# Patient Record
Sex: Female | Born: 1985 | Race: Black or African American | Hispanic: No | Smoking: Never smoker
Health system: Southern US, Community
[De-identification: ages and names within clinical notes are randomized; demographics above are authoritative.]

## PROBLEM LIST (undated history)

## (undated) DIAGNOSIS — F329 Major depressive disorder, single episode, unspecified: Secondary | ICD-10-CM

## (undated) DIAGNOSIS — F419 Anxiety disorder, unspecified: Secondary | ICD-10-CM

## (undated) DIAGNOSIS — R7303 Prediabetes: Secondary | ICD-10-CM

## (undated) DIAGNOSIS — R002 Palpitations: Secondary | ICD-10-CM

## (undated) DIAGNOSIS — D573 Sickle-cell trait: Secondary | ICD-10-CM

## (undated) DIAGNOSIS — D649 Anemia, unspecified: Secondary | ICD-10-CM

## (undated) DIAGNOSIS — L409 Psoriasis, unspecified: Secondary | ICD-10-CM

## (undated) DIAGNOSIS — R519 Headache, unspecified: Secondary | ICD-10-CM

## (undated) DIAGNOSIS — Z9889 Other specified postprocedural states: Secondary | ICD-10-CM

## (undated) DIAGNOSIS — F32A Depression, unspecified: Secondary | ICD-10-CM

## (undated) DIAGNOSIS — K219 Gastro-esophageal reflux disease without esophagitis: Secondary | ICD-10-CM

## (undated) DIAGNOSIS — D219 Benign neoplasm of connective and other soft tissue, unspecified: Secondary | ICD-10-CM

## (undated) DIAGNOSIS — R0600 Dyspnea, unspecified: Secondary | ICD-10-CM

## (undated) DIAGNOSIS — I10 Essential (primary) hypertension: Secondary | ICD-10-CM

## (undated) HISTORY — PX: TONSILLECTOMY: SUR1361

## (undated) HISTORY — DX: Benign neoplasm of connective and other soft tissue, unspecified: D21.9

## (undated) HISTORY — DX: Psoriasis, unspecified: L40.9

## (undated) HISTORY — DX: Sickle-cell trait: D57.3

---
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
## 2005-01-12 ENCOUNTER — Emergency Department: Payer: Self-pay | Admitting: Unknown Physician Specialty

## 2005-02-04 ENCOUNTER — Emergency Department: Payer: Self-pay | Admitting: Emergency Medicine

## 2005-05-30 ENCOUNTER — Emergency Department: Payer: Self-pay | Admitting: Emergency Medicine

## 2005-05-31 ENCOUNTER — Ambulatory Visit: Payer: Self-pay | Admitting: Emergency Medicine

## 2005-07-28 ENCOUNTER — Emergency Department: Payer: Self-pay | Admitting: Internal Medicine

## 2005-07-30 ENCOUNTER — Ambulatory Visit: Payer: Self-pay | Admitting: Obstetrics and Gynecology

## 2006-01-18 ENCOUNTER — Emergency Department: Payer: Self-pay | Admitting: Emergency Medicine

## 2006-01-20 ENCOUNTER — Emergency Department: Payer: Self-pay | Admitting: Emergency Medicine

## 2006-01-26 ENCOUNTER — Emergency Department: Payer: Self-pay | Admitting: Emergency Medicine

## 2010-09-15 ENCOUNTER — Emergency Department: Payer: Self-pay | Admitting: Emergency Medicine

## 2011-03-02 ENCOUNTER — Emergency Department: Payer: Self-pay | Admitting: Emergency Medicine

## 2011-03-09 ENCOUNTER — Emergency Department: Payer: Self-pay | Admitting: Emergency Medicine

## 2011-07-12 ENCOUNTER — Ambulatory Visit: Payer: Self-pay | Admitting: Internal Medicine

## 2011-07-31 ENCOUNTER — Ambulatory Visit: Payer: Self-pay | Admitting: Internal Medicine

## 2012-07-15 ENCOUNTER — Ambulatory Visit: Payer: Self-pay | Admitting: Family Medicine

## 2012-09-28 ENCOUNTER — Ambulatory Visit: Payer: Self-pay

## 2012-09-28 LAB — RAPID STREP-A WITH REFLX: Micro Text Report: NEGATIVE

## 2014-12-15 ENCOUNTER — Emergency Department: Payer: Self-pay | Admitting: Emergency Medicine

## 2015-12-19 ENCOUNTER — Ambulatory Visit
Admission: EM | Admit: 2015-12-19 | Discharge: 2015-12-19 | Disposition: A | Discharge status: Home / Self Care | Payer: Managed Care, Other (non HMO) | Attending: Family Medicine | Admitting: Family Medicine

## 2015-12-19 DIAGNOSIS — B349 Viral infection, unspecified: Secondary | ICD-10-CM

## 2015-12-19 LAB — RAPID STREP SCREEN (MED CTR MEBANE ONLY): Streptococcus, Group A Screen (Direct): NEGATIVE

## 2015-12-19 LAB — RAPID INFLUENZA A&B ANTIGENS: Influenza B (ARMC): NEGATIVE

## 2015-12-19 LAB — RAPID INFLUENZA A&B ANTIGENS (ARMC ONLY): INFLUENZA A (ARMC): NEGATIVE

## 2015-12-19 MED ORDER — HYDROCOD POLST-CPM POLST ER 10-8 MG/5ML PO SUER: 5.0000 mL | Freq: Every evening | ORAL | Status: DC | PRN

## 2015-12-19 MED ORDER — LIDOCAINE VISCOUS 2 % MT SOLN: 15.0000 mL | Freq: Three times a day (TID) | OROMUCOSAL | Status: DC | PRN

## 2015-12-19 MED ORDER — BENZONATATE 100 MG PO CAPS: 100.0000 mg | ORAL_CAPSULE | Freq: Three times a day (TID) | ORAL | Status: DC | PRN

## 2015-12-19 NOTE — ED Provider Notes (Signed)
[facility]  ____________________________________________  Time seen: Approximately 7:15 PM  I have reviewed the triage vital signs and the nursing notes.   HISTORY  Chief Complaint URI   HPI Cristina Allen is a 30 y.o. female presents for complaints of 3 days of runny nose, sore throat, nasal congestion and sinus drainage. States cough and chest congestion started yesterday. States "everyone" at work has been sick recently.  Denies fevers. Reports continues to eat and drink well. States main complaint at this time is sore throat and cough. States sore throat is a scratchy irritated feeling. Denies difficulty swallowing. Denies pain with eating. Reports initially thought that she was just having seasonal allergies. States symptoms have been unresolved over-the-counter Claritin or Allegra.  Denies chest pain or shortness of breath, chest pain with deep breath, palpitations, syncope, near syncope, dizziness, weakness, fevers, abdominal pain, weakness, extremity pain, extremity swelling.  Patient's last menstrual period was 11/29/2015. Denies chance of pregnancy.    History reviewed. No pertinent past medical history.  There are no active problems to display for this patient.   Past Surgical History  Procedure Laterality Date  . Tonsillectomy      Current Outpatient Rx  Name  Route  Sig  Dispense  Refill  .           .           .             Allergies Review of patient's allergies indicates no known allergies.  History reviewed. No pertinent family history.  Social History Social History  Substance Use Topics  . Smoking status: Former Research scientist (life sciences)  . Smokeless tobacco: Never Used  . Alcohol Use: Yes    Review of Systems Constitutional: No fever/chills Eyes: No visual changes. ENT: Positive runny nose, congestion, sore throat and intermittent cough. Cardiovascular: Denies chest pain. Respiratory: Denies shortness of breath. Gastrointestinal: No abdominal  pain.  No nausea, no vomiting.  No diarrhea.  No constipation. Genitourinary: Negative for dysuria. Musculoskeletal: Negative for back pain. Skin: Negative for rash. Neurological: Negative for headaches, focal weakness or numbness.  10-point ROS otherwise negative.  ____________________________________________   PHYSICAL EXAM:  VITAL SIGNS: ED Triage Vitals  Enc Vitals Group     BP 12/19/15 1810 130/86 mmHg     Pulse Rate 12/19/15 1810 86     Resp 12/19/15 1810 18     Temp 12/19/15 1810 97.9 F (36.6 C)     Temp Source 12/19/15 1810 Oral     SpO2 12/19/15 1810 97 %     Weight 12/19/15 1810 136 lb (61.689 kg)     Height 12/19/15 1810 5' (1.524 m)     Head Cir --      Peak Flow --      Pain Score 12/19/15 1813 6     Pain Loc --      Pain Edu? --      Excl. in [place]? --   Constitutional: Alert and oriented. Well appearing and in no acute distress. Eyes: Conjunctivae are normal. PERRL. EOMI. Head: Atraumatic. No sinus tenderness to palpation. No swelling. No erythema.  Ears: no erythema, normal TMs bilaterally.   Nose:Nasal congestion with clear rhinorrhea  Mouth/Throat: Mucous membranes are moist. Mild pharyngeal erythema. No tonsillar swelling or exudate.  Neck: No stridor.  No cervical spine tenderness to palpation. Hematological/Lymphatic/Immunilogical: No cervical lymphadenopathy. Cardiovascular: Normal rate, regular rhythm. Grossly normal heart sounds.  Good peripheral circulation.Chest nontender.  Respiratory: Normal respiratory effort.  No retractions. Lungs CTAB.No wheezes, rales or rhonchi. Good air movement.  Gastrointestinal: Soft and nontender. Normal Bowel sounds. No CVA tenderness. Musculoskeletal: No lower or upper extremity tenderness nor edema. No cervical, thoracic or lumbar tenderness to palpation. Neurologic:  Normal speech and language. No gross focal neurologic deficits are appreciated. No gait instability. Skin:  Skin is warm, dry and intact. No rash  noted. Psychiatric: Mood and affect are normal. Speech and behavior are normal.  ____________________________________________   LABS (all labs ordered are listed, but only abnormal results are displayed)  Labs Reviewed  RAPID INFLUENZA A&B ANTIGENS ([facility] ONLY)  RAPID STREP SCREEN (NOT AT [facility])  CULTURE, GROUP A STREP [facility])    INITIAL IMPRESSION / ASSESSMENT AND PLAN / ED COURSE  Pertinent labs & imaging results that were available during my care of the patient were reviewed by me and considered in my medical decision making (see chart for details).  Very well-appearing patient. Smiling in room. No acute chest. Presented with complaints of 3 days of runny nose, nasal congestion, sore throat and cough. States mild sore throat at this time. Denies other pain. Lungs clear throughout. Abdomen soft and nontender. Suspect viral illness. Will evaluate for influenza as well as strep.  Quick strep negative, will culture. Influenza negative. Discussed supportive and symptomatic treatments with patient. Will treat patient with when necessary viscous lidocaine gargles, when necessary Tessalon Perles during the day as well as Tussionex as needed at night. Encouraged patient to continue taking home Claritin. Encouraged rest, fluids, over-the-counter Tylenol or ibuprofen as needed. Work note for today and tomorrow given.  Discussed follow up with Primary care physician this week. Discussed follow up and return parameters including no resolution or any worsening concerns. Patient verbalized understanding and agreed to plan.   ____________________________________________   FINAL CLINICAL IMPRESSION(S) / ED DIAGNOSES  Final diagnoses:  Viral illness      Note: This dictation was prepared with Dragon dictation along with smaller phrase technology. Any transcriptional errors that result from this process are unintentional.    Marylene Land, NP 12/20/15 0008

## 2015-12-19 NOTE — Discharge Instructions (Signed)
Take medication as prescribed. Rest. Drink plenty of fluids. Take over the counter tylenol or ibuprofen as needed.   Follow up with your primary care physician this week as needed. Return to Urgent care for new or worsening concerns.  Viral Infections A viral infection can be caused by different types of viruses.Most viral infections are not serious and resolve on their own. However, some infections may cause severe symptoms and may lead to further complications. SYMPTOMS Viruses can frequently cause:  Minor sore throat.  Aches and pains.  Headaches.  Runny nose.  Different types of rashes.  Watery eyes.  Tiredness.  Cough.  Loss of appetite.  Gastrointestinal infections, resulting in nausea, vomiting, and diarrhea. These symptoms do not respond to antibiotics because the infection is not caused by bacteria. However, you might catch a bacterial infection following the viral infection. This is sometimes called a "superinfection." Symptoms of such a bacterial infection may include:  Worsening sore throat with pus and difficulty swallowing.  Swollen neck glands.  Chills and a high or persistent fever.  Severe headache.  Tenderness over the sinuses.  Persistent overall ill feeling (malaise), muscle aches, and tiredness (fatigue).  Persistent cough.  Yellow, green, or brown mucus production with coughing. HOME CARE INSTRUCTIONS   Only take over-the-counter or prescription medicines for pain, discomfort, diarrhea, or fever as directed by your caregiver.  Drink enough water and fluids to keep your urine clear or pale yellow. Sports drinks can provide valuable electrolytes, sugars, and hydration.  Get plenty of rest and maintain proper nutrition. Soups and broths with crackers or rice are fine. SEEK IMMEDIATE MEDICAL CARE IF:   You have severe headaches, shortness of breath, chest pain, neck pain, or an unusual rash.  You have uncontrolled vomiting, diarrhea, or you  are unable to keep down fluids.  You or your child has an oral temperature above 102 F (38.9 C), not controlled by medicine.  Your baby is older than 3 months with a rectal temperature of 102 F (38.9 C) or higher.  Your baby is 49 months old or younger with a rectal temperature of 100.4 F (38 C) or higher. MAKE SURE YOU:   Understand these instructions.  Will watch your condition.  Will get help right away if you are not doing well or get worse.   This information is not intended to replace advice given to you by your health care provider. Make sure you discuss any questions you have with your health care provider.   Document Released: 06/26/2005 Document Revised: 12/09/2011 Document Reviewed: 02/22/2015 Elsevier Interactive Patient Education [employer].

## 2015-12-19 NOTE — ED Notes (Signed)
Patient c/o sore throat, chest pain while coughing, nasal congestion, and headaches which all started this past Sunday.  Denies fever/c/n/v.

## 2015-12-21 LAB — CULTURE, GROUP A STREP (THRC)

## 2016-06-24 ENCOUNTER — Encounter: Payer: Self-pay | Admitting: Family Medicine

## 2016-06-24 ENCOUNTER — Ambulatory Visit (INDEPENDENT_AMBULATORY_CARE_PROVIDER_SITE_OTHER): Payer: Managed Care, Other (non HMO) | Admitting: Family Medicine

## 2016-06-24 VITALS — BP 130/80 | HR 68 | Ht 61.0 in | Wt 150.0 lb

## 2016-06-24 DIAGNOSIS — Z7189 Other specified counseling: Secondary | ICD-10-CM

## 2016-06-24 DIAGNOSIS — G43809 Other migraine, not intractable, without status migrainosus: Secondary | ICD-10-CM

## 2016-06-24 DIAGNOSIS — Z7689 Persons encountering health services in other specified circumstances: Secondary | ICD-10-CM

## 2016-06-24 NOTE — Patient Instructions (Signed)

## 2016-06-24 NOTE — Progress Notes (Signed)
Name: Cristina Allen   MRN: [id number]    DOB: 12-22-1985   Date:06/24/2016       Progress Note  Subjective  Chief Complaint  Chief Complaint  Patient presents with  . Establish Care    hasn't had a primary care physician  . Headache    longer than a year- has approx 1 per month. Needs referral to neurology    Headache   This is a recurrent problem. The current episode started 1 to 4 weeks ago. The problem occurs intermittently (come and go). Progression since onset: comes and goes. The pain is located in the bilateral (alternates sides) region. The pain quality is similar to prior headaches. The quality of the pain is described as throbbing. The pain is at a severity of 10/10. The pain is severe. Associated symptoms include nausea, phonophobia and photophobia. Pertinent negatives include no abdominal pain, back pain, blurred vision, coughing, dizziness, ear pain, eye redness, eye watering, facial sweating, fever, insomnia, loss of balance, neck pain, numbness, rhinorrhea, sinus pressure, sore throat, tingling, tinnitus or weight loss. The symptoms are aggravated by bright light and noise. She has tried Excedrin and acetaminophen for the symptoms. The treatment provided mild relief. Her past medical history is significant for migraine headaches. There is no history of cancer, cluster headaches, hypertension or TMJ.    No problem-specific Assessment & Plan notes found for this encounter.   Past Medical History:  Diagnosis Date  . Psoriasis of scalp     Past Surgical History:  Procedure Laterality Date  . DILATION AND CURETTAGE OF UTERUS  2006  . TONSILLECTOMY      Family History  Problem Relation Age of Onset  . Diabetes Maternal Grandfather     Social History   Social History  . Marital status: Single    Spouse name: N/A  . Number of children: N/A  . Years of education: N/A   Occupational History  . Not on file.   Social History Main Topics  . Smoking status: Former  Research scientist (life sciences)  . Smokeless tobacco: Never Used  . Alcohol use Yes  . Drug use: No  . Sexual activity: Yes   Other Topics Concern  . Not on file   Social History Narrative  . No narrative on file    No Known Allergies   Review of Systems  Constitutional: Negative for chills, fever, malaise/fatigue and weight loss.  HENT: Negative for ear discharge, ear pain, rhinorrhea, sinus pressure, sore throat and tinnitus.   Eyes: Positive for photophobia. Negative for blurred vision and redness.  Respiratory: Negative for cough, sputum production, shortness of breath and wheezing.   Cardiovascular: Negative for chest pain, palpitations and leg swelling.  Gastrointestinal: Positive for nausea. Negative for abdominal pain, blood in stool, constipation, diarrhea, heartburn and melena.  Genitourinary: Negative for dysuria, frequency, hematuria and urgency.  Musculoskeletal: Negative for back pain, joint pain, myalgias and neck pain.  Skin: Negative for rash.  Neurological: Positive for headaches. Negative for dizziness, tingling, sensory change, focal weakness, numbness and loss of balance.  Endo/Heme/Allergies: Negative for environmental allergies and polydipsia. Does not bruise/bleed easily.  Psychiatric/Behavioral: Negative for depression and suicidal ideas. The patient is not nervous/anxious and does not have insomnia.      Objective  Vitals:   06/24/16 1508  BP: 130/80  Pulse: 68  Weight: 150 lb (68 kg)  Height: 5\' 1"  (1.549 m)    Physical Exam  Constitutional: She is well-developed, well-nourished, and in no distress. No distress.  HENT:  Head: Normocephalic and atraumatic.  Right Ear: Tympanic membrane, external ear and ear canal normal.  Left Ear: Tympanic membrane, external ear and ear canal normal.  Nose: Nose normal. Right sinus exhibits no maxillary sinus tenderness and no frontal sinus tenderness. Left sinus exhibits no maxillary sinus tenderness and no frontal sinus tenderness.   Mouth/Throat: Uvula is midline and oropharynx is clear and moist.  Eyes: Conjunctivae and EOM are normal. Pupils are equal, round, and reactive to light. Right eye exhibits no discharge. Left eye exhibits no discharge.  Fundoscopic exam:      The right eye shows no arteriolar narrowing, no AV nicking and no papilledema.       The left eye shows no arteriolar narrowing, no AV nicking and no papilledema.  Neck: Normal range of motion. Neck supple. No JVD present. No thyromegaly present.  Cardiovascular: Normal rate, regular rhythm, normal heart sounds and intact distal pulses.  Exam reveals no gallop and no friction rub.   No murmur heard. Pulmonary/Chest: Effort normal and breath sounds normal.  Abdominal: Soft. Bowel sounds are normal. She exhibits no mass. There is no tenderness. There is no guarding.  Musculoskeletal: Normal range of motion. She exhibits no edema.  Lymphadenopathy:       Head (right side): No submental and no submandibular adenopathy present.       Head (left side): No submental and no submandibular adenopathy present.    She has no cervical adenopathy.    She has no axillary adenopathy.  Neurological: She is alert. She has normal motor skills, normal sensation, normal strength, normal reflexes and intact cranial nerves.  Skin: Skin is warm, dry and intact. She is not diaphoretic.  Psychiatric: Mood and affect normal.      Assessment & Plan  Problem List Items Addressed This Visit    None    Visit Diagnoses    Encounter to establish care with new doctor    -  Primary   Migraine variant       Relevant Orders   Ambulatory referral to Neurology     I spent 30 minutes with this patient, More than 50% of that time was spent in face to face education, counseling and care coordination.   Dr. Macon [facility]  06/24/16

## 2016-09-17 ENCOUNTER — Ambulatory Visit
Admission: EM | Admit: 2016-09-17 | Discharge: 2016-09-17 | Disposition: A | Discharge status: Home / Self Care | Payer: Managed Care, Other (non HMO) | Attending: Family Medicine | Admitting: Family Medicine

## 2016-09-17 ENCOUNTER — Encounter: Payer: Self-pay | Admitting: *Deleted

## 2016-09-17 DIAGNOSIS — A084 Viral intestinal infection, unspecified: Secondary | ICD-10-CM

## 2016-09-17 DIAGNOSIS — E876 Hypokalemia: Secondary | ICD-10-CM

## 2016-09-17 LAB — URINALYSIS, COMPLETE (UACMP) WITH MICROSCOPIC
Bilirubin Urine: NEGATIVE
GLUCOSE, UA: NEGATIVE mg/dL
KETONES UR: NEGATIVE mg/dL
Leukocytes, UA: NEGATIVE
Nitrite: NEGATIVE
Specific Gravity, Urine: 1.02 (ref 1.005–1.030)
pH: 5.5 (ref 5.0–8.0)

## 2016-09-17 LAB — CBC WITH DIFFERENTIAL/PLATELET
BASOS ABS: 0 10*3/uL (ref 0–0.1)
BASOS PCT: 0 %
EOS PCT: 1 %
Eosinophils Absolute: 0.1 10*3/uL (ref 0–0.7)
HCT: 40.3 % (ref 35.0–47.0)
Hemoglobin: 13.4 g/dL (ref 12.0–16.0)
LYMPHS PCT: 13 %
Lymphs Abs: 0.8 10*3/uL — ABNORMAL LOW (ref 1.0–3.6)
MCH: 26.7 pg (ref 26.0–34.0)
MCHC: 33.2 g/dL (ref 32.0–36.0)
MCV: 80.3 fL (ref 80.0–100.0)
MONO ABS: 0.4 10*3/uL (ref 0.2–0.9)
Monocytes Relative: 7 %
NEUTROS ABS: 5 10*3/uL (ref 1.4–6.5)
NEUTROS PCT: 79 %
PLATELETS: 199 10*3/uL (ref 150–440)
RBC: 5.02 MIL/uL (ref 3.80–5.20)
RDW: 13.6 % (ref 11.5–14.5)
WBC: 6.3 10*3/uL (ref 3.6–11.0)

## 2016-09-17 LAB — COMPREHENSIVE METABOLIC PANEL
ALBUMIN: 3.7 g/dL (ref 3.5–5.0)
ALT: 13 U/L — AB (ref 14–54)
AST: 23 U/L (ref 15–41)
Alkaline Phosphatase: 34 U/L — ABNORMAL LOW (ref 38–126)
Anion gap: 8 (ref 5–15)
BUN: 8 mg/dL (ref 6–20)
CHLORIDE: 103 mmol/L (ref 101–111)
CO2: 24 mmol/L (ref 22–32)
CREATININE: 0.76 mg/dL (ref 0.44–1.00)
Calcium: 8 mg/dL — ABNORMAL LOW (ref 8.9–10.3)
GFR calc Af Amer: >60 mL/min (ref >60)
GLUCOSE: 110 mg/dL — AB (ref 65–99)
POTASSIUM: 3.1 mmol/L — AB (ref 3.5–5.1)
SODIUM: 135 mmol/L (ref 135–145)
Total Bilirubin: 0.7 mg/dL (ref 0.3–1.2)
Total Protein: 6.9 g/dL (ref 6.5–8.1)

## 2016-09-17 MED ORDER — POTASSIUM CHLORIDE CRYS ER 20 MEQ PO TBCR
20.0000 meq | EXTENDED_RELEASE_TABLET | Freq: Once | ORAL | Status: AC
  Administered 2016-09-17: 20 meq via ORAL

## 2016-09-17 MED ORDER — ONDANSETRON 8 MG PO TBDP
8.0000 mg | ORAL_TABLET | Freq: Once | ORAL | Status: AC
  Administered 2016-09-17: 8 mg via ORAL

## 2016-09-17 MED ORDER — ONDANSETRON 8 MG PO TBDP: 8.0000 mg | ORAL_TABLET | Freq: Three times a day (TID) | ORAL | 0 refills | Status: DC | PRN

## 2016-09-17 NOTE — ED Provider Notes (Signed)
[facility]    CSN: [id number] Arrival date & time: 09/17/16  1535     History   Chief Complaint Chief Complaint  Patient presents with  . Back Pain  . Abdominal Pain  . Emesis  . Nausea    HPI Cristina Allen is a 30 y.o. female.   30 yo female with a c/o generalized abdominal pain, nausea, vomiting and diarrhea since yesterday. Denies any fevers, chills, melena, hematochezia, dysuria, vaginal discharge, known sick contacts.    The history is provided by the patient.  Back Pain  Associated symptoms: abdominal pain   Abdominal Pain  Associated symptoms: vomiting   Emesis  Associated symptoms: abdominal pain     Past Medical History:  Diagnosis Date  . Psoriasis of scalp     There are no active problems to display for this patient.   Past Surgical History:  Procedure Laterality Date  . DILATION AND CURETTAGE OF UTERUS  2006  . TONSILLECTOMY      OB History    No data available       Home Medications    Prior to Admission medications   Medication Sig Start Date End Date Taking? Authorizing Provider  clobetasol (OLUX) 0.05 % topical foam derm 05/28/16   Historical Provider, MD  ondansetron (ZOFRAN ODT) 8 MG disintegrating tablet Take 1 tablet (8 mg total) by mouth every 8 (eight) hours as needed for nausea or vomiting. 09/17/16   Norval Gable, MD  XOLEGEL 2 % GEL derm 05/28/16   Historical Provider, MD    Family History Family History  Problem Relation Age of Onset  . Diabetes Maternal Grandfather     Social History Social History  Substance Use Topics  . Smoking status: Former Research scientist (life sciences)  . Smokeless tobacco: Never Used  . Alcohol use Yes     Allergies   Patient has no known allergies.   Review of Systems Review of Systems  Gastrointestinal: Positive for abdominal pain and vomiting.  Musculoskeletal: Positive for back pain.     Physical Exam Triage Vital Signs ED Triage Vitals  Enc Vitals Group     BP 09/17/16 1653  121/90     Pulse Rate 09/17/16 1653 98     Resp 09/17/16 1653 16     Temp 09/17/16 1653 98.8 F (37.1 C)     Temp Source 09/17/16 1653 Oral     SpO2 09/17/16 1653 97 %     Weight 09/17/16 1655 136 lb (61.7 kg)     Height 09/17/16 1655 5' (1.524 m)     Head Circumference --      Peak Flow --      Pain Score --      Pain Loc --      Pain Edu? --      Excl. in [place]? --    No data found.   Updated Vital Signs BP 121/90 (BP Location: Left Arm)   Pulse 98   Temp 98.8 F (37.1 C) (Oral)   Resp 16   Ht 5' (1.524 m)   Wt 136 lb (61.7 kg)   LMP 09/13/2016 (Exact Date)   SpO2 97%   BMI 26.56 kg/m   Visual Acuity Right Eye Distance:   Left Eye Distance:   Bilateral Distance:    Right Eye Near:   Left Eye Near:    Bilateral Near:     Physical Exam  Constitutional: She appears well-developed and well-nourished. No distress.  Abdominal: Soft. Bowel sounds are  normal. She exhibits no distension and no mass. There is tenderness (mild, diffuse; no rebound or guarding). There is no rebound and no guarding.  Skin: She is not diaphoretic.  Nursing note and vitals reviewed.    UC Treatments / Results  Labs (all labs ordered are listed, but only abnormal results are displayed) Labs Reviewed  CBC WITH DIFFERENTIAL/PLATELET - Abnormal; Notable for the following:       Result Value   Lymphs Abs 0.8 (*)    All other components within normal limits  COMPREHENSIVE METABOLIC PANEL - Abnormal; Notable for the following:    Potassium 3.1 (*)    Glucose, Bld 110 (*)    Calcium 8.0 (*)    ALT 13 (*)    Alkaline Phosphatase 34 (*)    All other components within normal limits  URINALYSIS, COMPLETE (UACMP) WITH MICROSCOPIC - Abnormal; Notable for the following:    Hgb urine dipstick LARGE (*)    Protein, ur TRACE (*)    Squamous Epithelial / LPF 6-30 (*)    Bacteria, UA RARE (*)    All other components within normal limits    EKG  EKG Interpretation None       Radiology No  results found.  Procedures Procedures (including critical care time)  Medications Ordered in UC Medications  ondansetron (ZOFRAN-ODT) disintegrating tablet 8 mg (8 mg Oral Given 09/17/16 1710)  potassium chloride SA (K-DUR,KLOR-CON) CR tablet 20 mEq (20 mEq Oral Given 09/17/16 1807)     Initial Impression / Assessment and Plan / UC Course  I have reviewed the triage vital signs and the nursing notes.  Pertinent labs & imaging results that were available during my care of the patient were reviewed by me and considered in my medical decision making (see chart for details).  Clinical Course       Final Clinical Impressions(s) / UC Diagnoses   Final diagnoses:  Viral gastroenteritis  Hypokalemia    New Prescriptions Discharge Medication List as of 09/17/2016  6:02 PM    START taking these medications   Details  ondansetron (ZOFRAN ODT) 8 MG disintegrating tablet Take 1 tablet (8 mg total) by mouth every 8 (eight) hours as needed for nausea or vomiting., Starting Tue 09/17/2016, Normal       1. Lab results and diagnosis reviewed with patient 2. rx as per orders above; reviewed possible side effects, interactions, risks and benefits  3. Recommend supportive treatment with clear liquids/increased fluids then advance slowly as tolerated 4. Follow-up prn if symptoms worsen or don't improve   Norval Gable, MD 09/17/16 2019

## 2016-09-17 NOTE — ED Triage Notes (Signed)
Low back pain, diffuse abd pain, body aches, N/V/D since yesterday.

## 2017-02-25 ENCOUNTER — Encounter: Payer: Self-pay | Admitting: Family Medicine

## 2017-02-25 ENCOUNTER — Ambulatory Visit (INDEPENDENT_AMBULATORY_CARE_PROVIDER_SITE_OTHER): Payer: Managed Care, Other (non HMO) | Admitting: Family Medicine

## 2017-02-25 VITALS — BP 110/70 | HR 72 | Ht 60.0 in | Wt 154.0 lb

## 2017-02-25 DIAGNOSIS — M545 Low back pain, unspecified: Secondary | ICD-10-CM

## 2017-02-25 DIAGNOSIS — M542 Cervicalgia: Secondary | ICD-10-CM | POA: Diagnosis not present

## 2017-02-25 DIAGNOSIS — M546 Pain in thoracic spine: Secondary | ICD-10-CM

## 2017-02-25 MED ORDER — MELOXICAM 7.5 MG PO TABS: 7.5000 mg | ORAL_TABLET | Freq: Two times a day (BID) | ORAL | 1 refills | Status: DC

## 2017-02-25 MED ORDER — PREDNISONE 10 MG PO TABS: ORAL_TABLET | ORAL | 1 refills | Status: DC

## 2017-02-25 MED ORDER — CYCLOBENZAPRINE HCL 10 MG PO TABS: 10.0000 mg | ORAL_TABLET | Freq: Three times a day (TID) | ORAL | 0 refills | Status: DC | PRN
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
## 2017-03-05 ENCOUNTER — Ambulatory Visit: Payer: Managed Care, Other (non HMO) | Admitting: Family Medicine

## 2017-03-11 ENCOUNTER — Ambulatory Visit: Payer: Managed Care, Other (non HMO) | Admitting: Family Medicine

## 2017-03-24 ENCOUNTER — Ambulatory Visit: Payer: Managed Care, Other (non HMO) | Admitting: Family Medicine

## 2017-03-27 ENCOUNTER — Ambulatory Visit: Payer: Managed Care, Other (non HMO) | Admitting: Family Medicine

## 2017-06-26 ENCOUNTER — Ambulatory Visit (INDEPENDENT_AMBULATORY_CARE_PROVIDER_SITE_OTHER): Payer: Managed Care, Other (non HMO) | Admitting: Family Medicine

## 2017-06-26 ENCOUNTER — Encounter: Payer: Self-pay | Admitting: Family Medicine

## 2017-06-26 VITALS — BP 110/80 | HR 84 | Ht 60.0 in | Wt 159.0 lb

## 2017-06-26 DIAGNOSIS — G8929 Other chronic pain: Secondary | ICD-10-CM | POA: Diagnosis not present

## 2017-06-26 DIAGNOSIS — M545 Low back pain, unspecified: Secondary | ICD-10-CM

## 2017-06-26 DIAGNOSIS — M25512 Pain in left shoulder: Secondary | ICD-10-CM

## 2017-06-26 DIAGNOSIS — E66811 Obesity, class 1: Secondary | ICD-10-CM

## 2017-06-26 DIAGNOSIS — N949 Unspecified condition associated with female genital organs and menstrual cycle: Secondary | ICD-10-CM | POA: Diagnosis not present

## 2017-06-26 DIAGNOSIS — E669 Obesity, unspecified: Secondary | ICD-10-CM | POA: Diagnosis not present

## 2017-06-26 NOTE — Progress Notes (Signed)
Name: Cristina Allen   MRN: [id number]    DOB: 1986-06-15   Date:06/26/2017       Progress Note  Subjective  Chief Complaint  Chief Complaint  Patient presents with  . Back Pain    lower back pain  . Shoulder Pain    L) shoulder pain radiating down to wrist    Back Pain  This is a chronic problem. The current episode started more than 1 year ago. The problem occurs daily. The problem has been waxing and waning since onset. The pain is present in the lumbar spine. The quality of the pain is described as aching. The pain does not radiate. The pain is at a severity of 6/10. The pain is moderate. The pain is worse during the day. The symptoms are aggravated by sitting (cycle). Pertinent negatives include no abdominal pain, bladder incontinence, bowel incontinence, chest pain, dysuria, fever, headaches, leg pain, numbness, paresis, paresthesias, pelvic pain, perianal numbness, tingling, weakness or weight loss. She has tried NSAIDs and muscle relaxant (given order for physical therapy by ortho) for the symptoms. The treatment provided moderate relief.  Shoulder Pain   The pain is present in the left shoulder. This is a chronic problem. The current episode started more than 1 year ago. The problem occurs intermittently. The problem has been unchanged. The quality of the pain is described as aching. The pain is at a severity of 0/10. The pain is mild. Pertinent negatives include no fever, joint swelling, limited range of motion, numbness, stiffness or tingling. The symptoms are aggravated by activity (at work). She has tried NSAIDS for the symptoms. Family history does not include rheumatoid arthritis.    No problem-specific Assessment & Plan notes found for this encounter.   Past Medical History:  Diagnosis Date  . Psoriasis of scalp     Past Surgical History:  Procedure Laterality Date  . DILATION AND CURETTAGE OF UTERUS  2006  . TONSILLECTOMY      Family History  Problem Relation Age  of Onset  . Diabetes Maternal Grandfather     Social History   Social History  . Marital status: Single    Spouse name: N/A  . Number of children: N/A  . Years of education: N/A   Occupational History  . Not on file.   Social History Main Topics  . Smoking status: Former Research scientist (life sciences)  . Smokeless tobacco: Never Used  . Alcohol use Yes  . Drug use: No  . Sexual activity: Yes   Other Topics Concern  . Not on file   Social History Narrative  . No narrative on file    No Known Allergies  Outpatient Medications Prior to Visit  Medication Sig Dispense Refill  . cyclobenzaprine (FLEXERIL) 10 MG tablet Take 1 tablet (10 mg total) by mouth 3 (three) times daily as needed for muscle spasms. 30 tablet 0  . meloxicam (MOBIC) 7.5 MG tablet Take 1 tablet (7.5 mg total) by mouth 2 (two) times daily. 60 tablet 1  . XOLEGEL 2 % GEL derm    . clobetasol (OLUX) 0.05 % topical foam derm    . ondansetron (ZOFRAN ODT) 8 MG disintegrating tablet Take 1 tablet (8 mg total) by mouth every 8 (eight) hours as needed for nausea or vomiting. 6 tablet 0  . predniSONE (DELTASONE) 10 MG tablet Taper 6,6,6,5,5,5,4,4,3,3,2,2,1,1 53 tablet 1   No facility-administered medications prior to visit.     Review of Systems  Constitutional: Negative for chills, fever, malaise/fatigue and  weight loss.  HENT: Negative for ear discharge, ear pain and sore throat.   Eyes: Negative for blurred vision.  Respiratory: Negative for cough, sputum production, shortness of breath and wheezing.   Cardiovascular: Negative for chest pain, palpitations and leg swelling.  Gastrointestinal: Negative for abdominal pain, blood in stool, bowel incontinence, constipation, diarrhea, heartburn, melena and nausea.  Genitourinary: Negative for bladder incontinence, dysuria, frequency, hematuria, pelvic pain and urgency.  Musculoskeletal: Positive for back pain. Negative for joint pain, myalgias, neck pain and stiffness.  Skin: Negative  for rash.  Neurological: Negative for dizziness, tingling, sensory change, focal weakness, weakness, numbness, headaches and paresthesias.  Endo/Heme/Allergies: Negative for environmental allergies and polydipsia. Does not bruise/bleed easily.  Psychiatric/Behavioral: Negative for depression and suicidal ideas. The patient is not nervous/anxious and does not have insomnia.      Objective  Vitals:   06/26/17 0955  BP: 110/80  Pulse: 84  Weight: 159 lb (72.1 kg)  Height: 5' (1.524 m)    Physical Exam  Constitutional: She is well-developed, well-nourished, and in no distress. No distress.  HENT:  Head: Normocephalic and atraumatic.  Right Ear: External ear normal.  Left Ear: External ear normal.  Nose: Nose normal.  Mouth/Throat: Oropharynx is clear and moist.  Eyes: Pupils are equal, round, and reactive to light. Conjunctivae and EOM are normal. Right eye exhibits no discharge. Left eye exhibits no discharge.  Neck: Normal range of motion. Neck supple. No JVD present. No thyromegaly present.  Cardiovascular: Normal rate, regular rhythm, normal heart sounds and intact distal pulses.  Exam reveals no gallop and no friction rub.   No murmur heard. Pulmonary/Chest: Effort normal and breath sounds normal. She has no wheezes. She has no rales.  Abdominal: Soft. Bowel sounds are normal. She exhibits no mass. There is no tenderness. There is no guarding.  Musculoskeletal: Normal range of motion. She exhibits no edema.       Lumbar back: She exhibits spasm.  Lymphadenopathy:    She has no cervical adenopathy.  Neurological: She is alert. She has normal sensation, normal strength and normal reflexes. She has a normal Straight Leg Raise Test.  Skin: Skin is warm and dry. She is not diaphoretic.  Psychiatric: Mood and affect normal.  Nursing note and vitals reviewed.     Assessment & Plan  Problem List Items Addressed This Visit    None    Visit Diagnoses    Lumbar back pain    -   Primary   chronic/pt does not want sed rate or "more xrays"   Relevant Orders   Ambulatory referral to Gynecology   Chronic left shoulder pain       Menstrual symptom or sign       Relevant Orders   Ambulatory referral to Gynecology   Obesity (BMI 30.0-34.9)          No orders of the defined types were placed in this encounter.     Dr. [facility]  06/26/17

## 2017-06-26 NOTE — Patient Instructions (Signed)

## 2017-07-28 ENCOUNTER — Ambulatory Visit: Payer: Managed Care, Other (non HMO) | Admitting: Internal Medicine

## 2017-08-04 ENCOUNTER — Encounter: Payer: Self-pay | Admitting: Internal Medicine

## 2017-08-04 ENCOUNTER — Ambulatory Visit: Payer: Managed Care, Other (non HMO) | Admitting: Internal Medicine

## 2017-08-04 DIAGNOSIS — F411 Generalized anxiety disorder: Secondary | ICD-10-CM

## 2017-08-04 DIAGNOSIS — M62830 Muscle spasm of back: Secondary | ICD-10-CM

## 2017-08-04 DIAGNOSIS — L409 Psoriasis, unspecified: Secondary | ICD-10-CM

## 2017-08-04 MED ORDER — ESCITALOPRAM OXALATE 10 MG PO TABS: 10.0000 mg | ORAL_TABLET | Freq: Every day | ORAL | 2 refills | Status: DC

## 2017-08-04 MED ORDER — ALPRAZOLAM 0.25 MG PO TABS: 0.2500 mg | ORAL_TABLET | Freq: Two times a day (BID) | ORAL | 0 refills | Status: DC | PRN

## 2017-08-04 NOTE — Progress Notes (Signed)
HPI  Pt presents to the clinic today to establish care and for management of the conditions listed below. She is transferring care from Dr. Ronnald Ramp, in [place].  Chronic Muscles Spasms in Back: She is not sure what triggers this. She takes Meloxicam and Zanaflex as needed with good relief.   Psoriasis: Mainly in her scalp. She uses Clobetasol foam as needed with good relief.   She also reports feeling anxious at times. She reports this is worse on Sunday evenings, in anticipation of starting the work week. She reports there have been times during the week, she felt very anxious and overwhelmed, and had episodes of shortness of breath and chest tightness. She feels like these are panic attacks. Most of the time, she is able to distract herself to get them to stop. She has tried therapy in the past but did not feel like it was effective.   Flu: never Tetanus: unsure Pap Smear: 05/2017- normal Dentist: annually  Past Medical History:  Diagnosis Date  . Psoriasis of scalp     Current Outpatient Medications  Medication Sig Dispense Refill  . clobetasol (OLUX) 0.05 % topical foam derm    . cyclobenzaprine (FLEXERIL) 10 MG tablet Take 1 tablet (10 mg total) by mouth 3 (three) times daily as needed for muscle spasms. 30 tablet 0  . ibuprofen (ADVIL,MOTRIN) 600 MG tablet Take 1 tablet every 6 (six) hours by mouth.    . meloxicam (MOBIC) 7.5 MG tablet Take 1 tablet (7.5 mg total) by mouth 2 (two) times daily. 60 tablet 1  . tiZANidine (ZANAFLEX) 4 MG capsule Take 1 capsule 3 (three) times daily by mouth.    Ames Dura 2 % GEL derm     No current facility-administered medications for this visit.     No Known Allergies  Family History  Problem Relation Age of Onset  . Diabetes Maternal Grandfather   . Cancer Neg Hx   . Stroke Neg Hx   . Heart disease Neg Hx     Social History   Socioeconomic History  . Marital status: Single    Spouse name: Not on file  . Number of children: Not on file   . Years of education: Not on file  . Highest education level: Not on file  Social Needs  . Financial resource strain: Not on file  . Food insecurity - worry: Not on file  . Food insecurity - inability: Not on file  . Transportation needs - medical: Not on file  . Transportation needs - non-medical: Not on file  Occupational History  . Not on file  Tobacco Use  . Smoking status: Never Smoker  . Smokeless tobacco: Never Used  Substance and Sexual Activity  . Alcohol use: Yes    Comment: occasional  . Drug use: No  . Sexual activity: Yes  Other Topics Concern  . Not on file  Social History Narrative  . Not on file    ROS:  Constitutional: Denies fever, malaise, fatigue, headache or abrupt weight changes.  HEENT: Denies eye pain, eye redness, ear pain, ringing in the ears, wax buildup, runny nose, nasal congestion, bloody nose, or sore throat. Respiratory: Denies difficulty breathing, shortness of breath, cough or sputum production.   Cardiovascular: Denies chest pain, chest tightness, palpitations or swelling in the hands or feet.  Gastrointestinal: Denies abdominal pain, bloating, constipation, diarrhea or blood in the stool.  GU: Denies frequency, urgency, pain with urination, blood in urine, odor or discharge. Musculoskeletal: Pt reports  muscle spasms in back. Denies decrease in range of motion, difficulty with gait, or joint pain and swelling.  Skin: Denies redness, rashes, lesions or ulcercations.  Neurological: Denies dizziness, difficulty with memory, difficulty with speech or problems with balance and coordination.  Psych: Pt reports anxiety. Denies depression, SI/HI.  No other specific complaints in a complete review of systems (except as listed in HPI above).  PE:  BP 112/78   Pulse 74   Temp 98.1 F (36.7 C) (Oral)   Wt 160 lb (72.6 kg)   LMP 08/01/2017   SpO2 98%   BMI 31.25 kg/m  Wt Readings from Last 3 Encounters:  08/04/17 160 lb (72.6 kg)  06/26/17  159 lb (72.1 kg)  02/25/17 154 lb (69.9 kg)    General: Appears her stated age, obese, in NAD. Cardiovascular: Normal rate and rhythm. S1,S2 noted.  No murmur, rubs or gallops noted.  Pulmonary/Chest: Normal effort and positive vesicular breath sounds. No respiratory distress. No wheezes, rales or ronchi noted.  Musculoskeletal: Normal flexion, extension and rotation of the spine. No bony tenderness noted over the spine. Neurological: Alert and oriented.  Psychiatric: Mood and affect normal. Behavior is normal. Judgment and thought content normal.     BMET    Component Value Date/Time   NA 135 09/17/2016 1709   K 3.1 (L) 09/17/2016 1709   CL 103 09/17/2016 1709   CO2 24 09/17/2016 1709   GLUCOSE 110 (H) 09/17/2016 1709   BUN 8 09/17/2016 1709   CREATININE 0.76 09/17/2016 1709   CALCIUM 8.0 (L) 09/17/2016 1709   GFRNONAA >60 09/17/2016 1709   GFRAA >60 09/17/2016 1709    Lipid Panel  No results found for: CHOL, TRIG, HDL, CHOLHDL, VLDL, LDLCALC  CBC    Component Value Date/Time   WBC 6.3 09/17/2016 1709   RBC 5.02 09/17/2016 1709   HGB 13.4 09/17/2016 1709   HCT 40.3 09/17/2016 1709   PLT 199 09/17/2016 1709   MCV 80.3 09/17/2016 1709   MCH 26.7 09/17/2016 1709   MCHC 33.2 09/17/2016 1709   RDW 13.6 09/17/2016 1709   LYMPHSABS 0.8 (L) 09/17/2016 1709   MONOABS 0.4 09/17/2016 1709   EOSABS 0.1 09/17/2016 1709   BASOSABS 0.0 09/17/2016 1709    Hgb A1C No results found for: HGBA1C   Assessment and Plan:

## 2017-08-05 DIAGNOSIS — F411 Generalized anxiety disorder: Secondary | ICD-10-CM | POA: Insufficient documentation

## 2017-08-05 DIAGNOSIS — M62830 Muscle spasm of back: Secondary | ICD-10-CM | POA: Insufficient documentation

## 2017-08-05 DIAGNOSIS — L409 Psoriasis, unspecified: Secondary | ICD-10-CM | POA: Insufficient documentation

## 2017-08-05 NOTE — Patient Instructions (Signed)

## 2017-08-05 NOTE — Assessment & Plan Note (Signed)
Support offered today Discussed treatment options, she is not interested in therapy at this time eRx for Lexapro 10 mg daily eRx for Xanax 0.25 mg to take on a very rare as needed basis for panic attacks  RTC or update me in 1 month via mychart and let me know how you are doing.

## 2017-08-05 NOTE — Assessment & Plan Note (Signed)
Continue Clobetasol prn

## 2017-08-05 NOTE — Assessment & Plan Note (Signed)
Continue Meloxicam and Zanaflex prn

## 2017-08-08 ENCOUNTER — Other Ambulatory Visit: Payer: Self-pay

## 2017-08-12 ENCOUNTER — Other Ambulatory Visit: Payer: Self-pay

## 2017-09-10 ENCOUNTER — Encounter: Payer: Self-pay | Admitting: Internal Medicine

## 2017-09-10 ENCOUNTER — Other Ambulatory Visit: Payer: Self-pay | Admitting: Internal Medicine

## 2017-09-10 NOTE — Telephone Encounter (Signed)
Ok to phone in Xanax 

## 2017-09-10 NOTE — Telephone Encounter (Signed)
Last filled 08/04/17... Please advise--- I know pt also had an email talking about anxiety etc

## 2017-09-11 NOTE — Telephone Encounter (Signed)
Rx called in to pharmacy. 

## 2017-10-14 ENCOUNTER — Other Ambulatory Visit: Payer: Self-pay | Admitting: Internal Medicine

## 2017-10-14 NOTE — Telephone Encounter (Signed)
Last filled 09/10/17.Marland KitchenMarland Kitchenplease advise

## 2017-10-14 NOTE — Telephone Encounter (Signed)
I advised her to take this as rarely as possible. She is taking this too frequently. If her anxiety is this bad, we need to increase her Lexapro.

## 2017-10-20 ENCOUNTER — Encounter: Payer: Self-pay | Admitting: Internal Medicine

## 2017-10-20 ENCOUNTER — Other Ambulatory Visit: Payer: Self-pay | Admitting: Internal Medicine

## 2017-10-21 NOTE — Telephone Encounter (Signed)
Are you still not going to refill medication, another request was received... Please advise

## 2017-10-22 MED ORDER — ESCITALOPRAM OXALATE 20 MG PO TABS: 20.0000 mg | ORAL_TABLET | Freq: Every day | ORAL | 2 refills | Status: DC

## 2017-10-22 NOTE — Telephone Encounter (Signed)
We will try increasing Lexapro first. Refill denied

## 2017-11-24 ENCOUNTER — Ambulatory Visit: Payer: Managed Care, Other (non HMO) | Admitting: Internal Medicine

## 2017-11-24 ENCOUNTER — Encounter: Payer: Self-pay | Admitting: Internal Medicine

## 2017-11-24 DIAGNOSIS — F411 Generalized anxiety disorder: Secondary | ICD-10-CM

## 2017-11-24 MED ORDER — ALPRAZOLAM 0.25 MG PO TABS: 0.2500 mg | ORAL_TABLET | Freq: Every day | ORAL | 0 refills | Status: DC | PRN

## 2017-11-24 MED ORDER — MUPIROCIN 2 % EX OINT: 1.0000 "application " | TOPICAL_OINTMENT | Freq: Two times a day (BID) | CUTANEOUS | 0 refills | Status: DC

## 2017-11-24 NOTE — Assessment & Plan Note (Signed)
Mild improvement Support offered today Will continue Lexapro for now Refilled Xanax, discussed rare use, sedation and addiction

## 2017-11-24 NOTE — Patient Instructions (Signed)

## 2017-11-24 NOTE — Progress Notes (Signed)
Subjective:    Patient ID: Cristina Allen, female    DOB: [birth year], 32 y.o.   MRN: [id number]  HPI  Pt presents to the clinic today to follow up anxiety. At her last visit, she was started on Lexapro. She was given a RX for Xanax to take on a very rare basis. Since starting the medications, she feels like her anxiety is not worse, but not really a whole lot better. She takes the Xanax only on Sunday evenings. Her job is her major stressor, and she does start a new position in a few weeks, which she is excited but also anxious about. She denies depression, SI/HI.  She also thinks a recent tattoo she got 2 weeks ago is infected. There is any area that was red, swollen and drained pus/blood. She denies fever, chills or body aches. She has cleansed the area with alcohol.  Review of Systems      Past Medical History:  Diagnosis Date  . Psoriasis of scalp     Current Outpatient Medications  Medication Sig Dispense Refill  . ALPRAZolam (XANAX) 0.25 MG tablet TAKE ONE TABLET BY MOUTH TWICE DAILY AS NEEDED FOR ANXIETY. 20 tablet 0  . escitalopram (LEXAPRO) 20 MG tablet Take 1 tablet (20 mg total) by mouth daily. 30 tablet 2  . tiZANidine (ZANAFLEX) 4 MG capsule Take 1 capsule 3 (three) times daily by mouth.    . clobetasol (OLUX) 0.05 % topical foam derm    . meloxicam (MOBIC) 7.5 MG tablet Take 1 tablet (7.5 mg total) by mouth 2 (two) times daily. (Patient not taking: Reported on 11/24/2017) 60 tablet 1   No current facility-administered medications for this visit.     No Known Allergies  Family History  Problem Relation Age of Onset  . Diabetes Maternal Grandfather   . Cancer Neg Hx   . Stroke Neg Hx   . Heart disease Neg Hx     Social History   Socioeconomic History  . Marital status: Single    Spouse name: Not on file  . Number of children: Not on file  . Years of education: Not on file  . Highest education level: Not on file  Social Needs  . Financial resource strain:  Not on file  . Food insecurity - worry: Not on file  . Food insecurity - inability: Not on file  . Transportation needs - medical: Not on file  . Transportation needs - non-medical: Not on file  Occupational History  . Not on file  Tobacco Use  . Smoking status: Never Smoker  . Smokeless tobacco: Never Used  Substance and Sexual Activity  . Alcohol use: Yes    Comment: occasional  . Drug use: No  . Sexual activity: Yes  Other Topics Concern  . Not on file  Social History Narrative  . Not on file     Constitutional: Denies fever, malaise, fatigue, headache or abrupt weight changes.  Respiratory: Denies difficulty breathing, shortness of breath, cough or sputum production.   Cardiovascular: Denies chest pain, chest tightness, palpitations or swelling in the hands or feet.  Skin: Pt reports scab on top of right foot, tender to touch. Denies redness, rashes, or ulcercations.  Neurological: Denies dizziness, difficulty with memory, difficulty with speech or problems with balance and coordination.  Psych: Pt reports anxiety. Denies depression, SI/HI.  No other specific complaints in a complete review of systems (except as listed in HPI above).  Objective:   Physical Exam  BP 120/84 (BP Location: Left Arm, Patient Position: Sitting, Cuff Size: Normal)   Pulse 83   Temp 98.4 F (36.9 C) (Oral)   Ht 5' (1.524 m)   Wt 167 lb (75.8 kg)   LMP 11/04/2017   SpO2 98%   BMI 32.61 kg/m  Wt Readings from Last 3 Encounters:  11/24/17 167 lb (75.8 kg)  08/04/17 160 lb (72.6 kg)  06/26/17 159 lb (72.1 kg)    General: Appears her stated age, well developed, well nourished in NAD. Skin: Small pea size scab noted on dorsal surface of right foot. Tender to touch, but no warmth, redness or drainage noted.  Neurological: Alert and oriented.  Psychiatric: She is mildly anxious appearing today.  BMET    Component Value Date/Time   NA 135 09/17/2016 1709   K 3.1 (L) 09/17/2016 1709    CL 103 09/17/2016 1709   CO2 24 09/17/2016 1709   GLUCOSE 110 (H) 09/17/2016 1709   BUN 8 09/17/2016 1709   CREATININE 0.76 09/17/2016 1709   CALCIUM 8.0 (L) 09/17/2016 1709   GFRNONAA >60 09/17/2016 1709   GFRAA >60 09/17/2016 1709    Lipid Panel  No results found for: CHOL, TRIG, HDL, CHOLHDL, VLDL, LDLCALC  CBC    Component Value Date/Time   WBC 6.3 09/17/2016 1709   RBC 5.02 09/17/2016 1709   HGB 13.4 09/17/2016 1709   HCT 40.3 09/17/2016 1709   PLT 199 09/17/2016 1709   MCV 80.3 09/17/2016 1709   MCH 26.7 09/17/2016 1709   MCHC 33.2 09/17/2016 1709   RDW 13.6 09/17/2016 1709   LYMPHSABS 0.8 (L) 09/17/2016 1709   MONOABS 0.4 09/17/2016 1709   EOSABS 0.1 09/17/2016 1709   BASOSABS 0.0 09/17/2016 1709    Hgb A1C No results found for: HGBA1C         Assessment & Plan:   Lesion of Right Foot:  eRx for Bactroban 2 x day until healed  RTC in 9 months for your annual exam Webb Silversmith, NP

## 2018-01-26 ENCOUNTER — Other Ambulatory Visit: Payer: Self-pay | Admitting: Internal Medicine

## 2018-01-26 NOTE — Telephone Encounter (Signed)
Last filled 11/24/17... Please advise

## 2018-02-19 ENCOUNTER — Ambulatory Visit: Payer: Self-pay | Admitting: *Deleted

## 2018-02-19 NOTE — Telephone Encounter (Signed)
noted 

## 2018-02-19 NOTE — Telephone Encounter (Signed)
Pt called thinking she was calling her OB GYN. She had taken a pregnancy test on last Thursday and had bleeding on this past Sunday.  She is going to get in touch with her OB doctor.

## 2018-03-18 ENCOUNTER — Other Ambulatory Visit: Payer: Self-pay | Admitting: Internal Medicine

## 2018-03-19 NOTE — Telephone Encounter (Signed)
Last filled 01/26/18 # 20... Please advise

## 2018-09-30 ENCOUNTER — Ambulatory Visit
Admission: EM | Admit: 2018-09-30 | Discharge: 2018-09-30 | Disposition: A | Discharge status: Home / Self Care | Payer: Managed Care, Other (non HMO) | Attending: Physician Assistant | Admitting: Physician Assistant

## 2018-09-30 ENCOUNTER — Ambulatory Visit (INDEPENDENT_AMBULATORY_CARE_PROVIDER_SITE_OTHER): Payer: Managed Care, Other (non HMO)

## 2018-09-30 ENCOUNTER — Other Ambulatory Visit: Payer: Self-pay

## 2018-09-30 ENCOUNTER — Encounter: Payer: Self-pay | Admitting: Gynecology

## 2018-09-30 DIAGNOSIS — S93401A Sprain of unspecified ligament of right ankle, initial encounter: Secondary | ICD-10-CM

## 2018-09-30 DIAGNOSIS — S99921A Unspecified injury of right foot, initial encounter: Secondary | ICD-10-CM | POA: Insufficient documentation

## 2018-09-30 DIAGNOSIS — W500XXA Accidental hit or strike by another person, initial encounter: Secondary | ICD-10-CM | POA: Diagnosis not present

## 2018-09-30 DIAGNOSIS — M79671 Pain in right foot: Secondary | ICD-10-CM | POA: Diagnosis not present

## 2018-09-30 DIAGNOSIS — S9031XA Contusion of right foot, initial encounter: Secondary | ICD-10-CM

## 2018-09-30 HISTORY — DX: Anxiety disorder, unspecified: F41.9

## 2018-09-30 HISTORY — DX: Major depressive disorder, single episode, unspecified: F32.9

## 2018-09-30 HISTORY — DX: Depression, unspecified: F32.A

## 2018-09-30 HISTORY — DX: Gastro-esophageal reflux disease without esophagitis: K21.9

## 2018-09-30 MED ORDER — IBUPROFEN 800 MG PO TABS: 800.0000 mg | ORAL_TABLET | Freq: Three times a day (TID) | ORAL | 0 refills | Status: AC | PRN

## 2018-09-30 NOTE — ED Triage Notes (Signed)
Per patient sister fell on her right foot x today. Per patient now with right ankle pain.

## 2018-09-30 NOTE — ED Provider Notes (Signed)
[facility]    CSN: [id number] Arrival date & time: 09/30/18  1320     History   Chief Complaint No chief complaint on file.   HPI Cristina Allen is a 33 y.o. female. Patient presents today for right foot pain and swelling following injury last night. She states that her adult sister fell onto her foot and she has not been able to put weight on the foot w/o severe pain. Pain is 8/10. She says she has iced the foot, elevated it, and taken NSAIDs w/o much relief. Denies numbness/tingling/weakness. She has no other concerns today.  HPI  Past Medical History:  Diagnosis Date  . Anxiety and depression   . GERD (gastroesophageal reflux disease)   . Psoriasis of scalp     Patient Active Problem List   Diagnosis Date Noted  . Psoriasis 08/05/2017  . GAD (generalized anxiety disorder) 08/05/2017    Past Surgical History:  Procedure Laterality Date  . DILATION AND CURETTAGE OF UTERUS  2006  . TONSILLECTOMY      OB History   No obstetric history on file.      Home Medications    Prior to Admission medications   Medication Sig Start Date End Date Taking? Authorizing Provider  ALPRAZolam Duanne Moron) 0.25 MG tablet TAKE 1 TABLET BY MOUTH ONCE DAILY AS NEEDED FOR ANXIETY 03/19/18  Yes Baity, Coralie Keens, NP  clobetasol (OLUX) 0.05 % topical foam derm 05/28/16  Yes [provider]  escitalopram (LEXAPRO) 20 MG tablet Take 1 tablet (20 mg total) by mouth daily. MUST SCHEDULE ANNUAL PHYSICAL 03/19/18  Yes Jearld Fenton, NP  meloxicam (MOBIC) 7.5 MG tablet Take 1 tablet (7.5 mg total) by mouth 2 (two) times daily. Patient not taking: Reported on 11/24/2017 02/25/17   Juline Patch, MD  mupirocin ointment (BACTROBAN) 2 % Place 1 application into the nose 2 (two) times daily. 11/24/17   Jearld Fenton, NP    Family History Family History  Problem Relation Age of Onset  . Diabetes Maternal Grandfather   . Cancer Neg Hx   . Stroke Neg Hx   . Heart disease Neg Hx       Social History Social History   Tobacco Use  . Smoking status: Never Smoker  . Smokeless tobacco: Never Used  Substance Use Topics  . Alcohol use: Yes    Comment: occasional  . Drug use: No     Allergies   Patient has no known allergies.   Review of Systems Review of Systems  Constitutional: Negative for fatigue and fever.  Gastrointestinal: Negative for nausea and vomiting.  Musculoskeletal: Positive for arthralgias, gait problem and joint swelling. Negative for myalgias.  Skin: Negative for rash and wound.  Neurological: Negative for weakness and numbness.  Hematological: Does not bruise/bleed easily.     Physical Exam Triage Vital Signs ED Triage Vitals  Enc Vitals Group     BP 09/30/18 1404 (!) 133/97     Pulse Rate 09/30/18 1404 86     Resp 09/30/18 1404 16     Temp 09/30/18 1404 98.1 F (36.7 C)     Temp Source 09/30/18 1404 Oral     SpO2 09/30/18 1404 99 %     Weight 09/30/18 1403 165 lb (74.8 kg)     Height 09/30/18 1403 5' (1.524 m)     Head Circumference --      Peak Flow --      Pain Score 09/30/18 1403 8  Pain Loc --      Pain Edu? --      Excl. in [place]? --    No data found.  Updated Vital Signs BP (!) 133/97 (BP Location: Left Arm)   Pulse 86   Temp 98.1 F (36.7 C) (Oral)   Resp 16   Ht 5' (1.524 m)   Wt 165 lb (74.8 kg)   LMP 09/30/2018   SpO2 99%   BMI 32.22 kg/m        Physical Exam Vitals signs and nursing note reviewed.  Constitutional:      General: She is not in acute distress.    Appearance: Normal appearance. She is normal weight. She is not ill-appearing or toxic-appearing.  HENT:     Head: Normocephalic and atraumatic.  Eyes:     General: No scleral icterus.    Conjunctiva/sclera: Conjunctivae normal.  Neck:     Musculoskeletal: Neck supple.  Cardiovascular:     Rate and Rhythm: Normal rate and regular rhythm.     Pulses: Normal pulses.  Pulmonary:     Effort: Pulmonary effort is normal. No respiratory  distress.     Breath sounds: Normal breath sounds. No wheezing or rhonchi.  Musculoskeletal:     Comments: RIGHT FOOT: There is mild-moderate swelling of the dorsal hindfoot with slight overlying erythema, no ecchymosis, abrasions, lacerations. Full ROM, no instability, 5/5 strength, NVI  Skin:    General: Skin is warm and dry.     Findings: Erythema present. No bruising or rash.  Neurological:     Mental Status: She is alert and oriented to person, place, and time.     Sensory: No sensory deficit.     Motor: No weakness.     Gait: Gait abnormal.     Deep Tendon Reflexes: Reflexes normal.  Psychiatric:        Mood and Affect: Mood normal.        Behavior: Behavior normal.        Thought Content: Thought content normal.      UC Treatments / Results  Labs (all labs ordered are listed, but only abnormal results are displayed) Labs Reviewed - No data to display  EKG None  Radiology Dg Foot Complete Right  Result Date: 09/30/2018 CLINICAL DATA:  [place] onto foot, pain along top of foot EXAM: RIGHT FOOT COMPLETE - 3+ VIEW COMPARISON:  None. FINDINGS: There is no evidence of fracture or dislocation. There is no evidence of arthropathy or other focal bone abnormality. Soft tissues are unremarkable. IMPRESSION: Negative. Electronically Signed   By: Donavan Foil M.D.   On: 09/30/2018 14:35    Procedures Procedures (including critical care time)  Medications Ordered in UC Medications - No data to display  Initial Impression / Assessment and Plan / UC Course  I have reviewed the triage vital signs and the nursing notes.  Pertinent labs & imaging results that were available during my care of the patient were reviewed by me and considered in my medical decision making (see chart for details).     Final Clinical Impressions(s) / UC Diagnoses   Final diagnoses:  Contusion of right foot, initial encounter  Injury of right foot, initial encounter  Right foot pain     Discharge  Instructions     CONTUSION OF FOOT: X-rays were negative for acute fracture today. Stressed avoiding painful activities . Reviewed RICE guidelines. Use medications as directed, including NSAIDs. I have explained that it is acceptable to take NSAIDs every  4-6 hours and they should not cause drowsiness. May use Tylenol in between doses of NSAIDs. F/u with PCP or return to our office for reexamination, and please feel free to call or return at any time for any questions or concerns you may have and we will be happy to help you!     ED Prescriptions    None     Controlled Substance Prescriptions [place] Controlled Substance Registry consulted? Not Applicable   Gretta Cool 10/01/18 2055

## 2018-09-30 NOTE — Discharge Instructions (Addendum)
CONTUSION/SPRAIN OF FOOT: X-rays were negative for acute fracture today. Stressed avoiding painful activities . Reviewed RICE guidelines. Wear supportive ankle brace. Use medications as directed, including NSAIDs. I have explained that it is acceptable to take NSAIDs every 4-6 hours and they should not cause drowsiness. May use Tylenol in between doses of NSAIDs. F/u with PCP or return to our office for reexamination, and please feel free to call or return at any time for any questions or concerns you may have and we will be happy to help you!

## 2018-10-05 ENCOUNTER — Other Ambulatory Visit: Payer: Self-pay | Admitting: Unknown Physician Specialty

## 2018-10-05 DIAGNOSIS — M5412 Radiculopathy, cervical region: Secondary | ICD-10-CM

## 2018-10-05 DIAGNOSIS — M5013 Cervical disc disorder with radiculopathy, cervicothoracic region: Secondary | ICD-10-CM

## 2018-10-16 ENCOUNTER — Ambulatory Visit: Admission: RE | Admit: 2018-10-16 | Payer: Managed Care, Other (non HMO) | Source: Ambulatory Visit

## 2018-10-30 ENCOUNTER — Ambulatory Visit
Admission: RE | Admit: 2018-10-30 | Discharge: 2018-10-30 | Disposition: A | Discharge status: Home / Self Care | Payer: Managed Care, Other (non HMO) | Source: Ambulatory Visit | Attending: Unknown Physician Specialty | Admitting: Unknown Physician Specialty

## 2018-10-30 DIAGNOSIS — M5412 Radiculopathy, cervical region: Secondary | ICD-10-CM | POA: Diagnosis not present

## 2018-10-30 DIAGNOSIS — M5013 Cervical disc disorder with radiculopathy, cervicothoracic region: Secondary | ICD-10-CM | POA: Insufficient documentation

## 2019-03-11 ENCOUNTER — Other Ambulatory Visit: Payer: Self-pay | Admitting: Neurology

## 2019-03-11 DIAGNOSIS — G35 Multiple sclerosis: Secondary | ICD-10-CM

## 2019-03-24 ENCOUNTER — Ambulatory Visit: Payer: Managed Care, Other (non HMO)

## 2019-08-11 ENCOUNTER — Ambulatory Visit
Admission: EM | Admit: 2019-08-11 | Discharge: 2019-08-11 | Disposition: A | Discharge status: Home / Self Care | Payer: Managed Care, Other (non HMO) | Attending: Family Medicine | Admitting: Family Medicine

## 2019-08-11 ENCOUNTER — Other Ambulatory Visit: Payer: Self-pay

## 2019-08-11 DIAGNOSIS — M5441 Lumbago with sciatica, right side: Secondary | ICD-10-CM | POA: Diagnosis not present

## 2019-08-11 MED ORDER — CYCLOBENZAPRINE HCL 10 MG PO TABS: 10.0000 mg | ORAL_TABLET | Freq: Two times a day (BID) | ORAL | 0 refills | Status: DC | PRN

## 2019-08-11 MED ORDER — MELOXICAM 15 MG PO TABS: 15.0000 mg | ORAL_TABLET | Freq: Every day | ORAL | 0 refills | Status: DC | PRN

## 2019-08-11 NOTE — ED Provider Notes (Signed)
[facility] ____________________________________________  Time seen: Approximately 7:56 PM  I have reviewed the triage vital signs and the nursing notes.   HISTORY  Chief Complaint Muscle Pain  HPI Cristina Allen is a 33 y.o. female presenting for evaluation of right lower back pain present for the last several days.  Reports Saturday she woke up and noticed the pain.  Reports pain has remained in right lower back and occasionally felt pain going down her right leg.  Denies any paresthesias, decreased range of motion, urinary or bowel retention or incontinence, rash, fall or direct injury.  Patient does report in the last few weeks she has been increasing her activity levels and doing a lot of squats and recumbent bicycling.  No direct injury or fall.  Has taken occasional ibuprofen without resolution.  No medication taken today.  No dysuria, abdominal pain, chest pain, shortness of breath or fevers.  Reports pain improves with lying down in a comfortable position and is worse with activity, particularly bending forward and twisting.  Elgie Collard, MD : PCP  Patient's last menstrual period was 08/01/2019. Denies pregnancy.   Past Medical History:  Diagnosis Date  . Anxiety and depression   . GERD (gastroesophageal reflux disease)   . Psoriasis of scalp     Patient Active Problem List   Diagnosis Date Noted  . Psoriasis 08/05/2017  . GAD (generalized anxiety disorder) 08/05/2017    Past Surgical History:  Procedure Laterality Date  . DILATION AND CURETTAGE OF UTERUS  2006  . TONSILLECTOMY       No current facility-administered medications for this encounter.   Current Outpatient Medications:  .  ALPRAZolam (XANAX) 0.25 MG tablet, TAKE 1 TABLET BY MOUTH ONCE DAILY AS NEEDED FOR ANXIETY, Disp: 20 tablet, Rfl: 0 .  cyclobenzaprine (FLEXERIL) 10 MG tablet, Take 1 tablet (10 mg total) by mouth 2 (two) times daily as needed for muscle spasms. Do not drive  while taking as can cause drowsiness, Disp: 15 tablet, Rfl: 0 .  escitalopram (LEXAPRO) 20 MG tablet, Take 1 tablet (20 mg total) by mouth daily. MUST SCHEDULE ANNUAL PHYSICAL, Disp: 30 tablet, Rfl: 2 .  meloxicam (MOBIC) 15 MG tablet, Take 1 tablet (15 mg total) by mouth daily as needed., Disp: 10 tablet, Rfl: 0  Allergies Patient has no known allergies.  Family History  Problem Relation Age of Onset  . Diabetes Maternal Grandfather   . Healthy Mother   . Healthy Father   . Cancer Neg Hx   . Stroke Neg Hx   . Heart disease Neg Hx     Social History Social History   Tobacco Use  . Smoking status: Never Smoker  . Smokeless tobacco: Never Used  Substance Use Topics  . Alcohol use: Yes    Comment: occasional  . Drug use: No    Review of Systems Constitutional: No fever ENT: No sore throat. Cardiovascular: Denies chest pain. Respiratory: Denies shortness of breath. Gastrointestinal: No abdominal pain.  No nausea, no vomiting.  No diarrhea.  Genitourinary: Negative for dysuria. Musculoskeletal: Positive right lower back pain. Skin: Negative for rash.  ____________________________________________   PHYSICAL EXAM:  VITAL SIGNS: ED Triage Vitals  Enc Vitals Group     BP 08/11/19 1938 (!) 139/102     Pulse Rate 08/11/19 1938 69     Resp 08/11/19 1938 19     Temp 08/11/19 1938 98.1 F (36.7 C)     Temp Source 08/11/19 1938 Oral  SpO2 08/11/19 1938 99 %     Weight 08/11/19 1940 163 lb (73.9 kg)     Height --      Head Circumference --      Peak Flow --      Pain Score 08/11/19 1940 8     Pain Loc --      Pain Edu? --      Excl. in [place]? --     Constitutional: Alert and oriented. Well appearing and in no acute distress. Eyes: Conjunctivae are normal.  ENT      Head: Normocephalic and atraumatic. Cardiovascular: Normal rate, regular rhythm. Grossly normal heart sounds.  Good peripheral circulation. Respiratory: Normal respiratory effort without tachypnea nor  retractions. Breath sounds are clear and equal bilaterally. No wheezes, rales, rhonchi. Gastrointestinal: Soft and nontender. No CVA tenderness. Musculoskeletal:  No midline cervical, thoracic or lumbar tenderness to palpation. Bilateral pedal pulses equal and easily palpated. Except: No midline tenderness.  Right lower paralumbar or sciatic tenderness to palpation, no rash, no swelling, no point bony tenderness, pain increases with lumbar flexion extension as well as rotation but full range of motion present, also mild pain with overhead reaching, no pain with bilateral standing knee lifts, no saddle anesthesia, changes positions quickly. Neurologic:  Normal speech and language. Speech is normal. No gait instability.  Skin:  Skin is warm, dry and intact. No rash noted. Psychiatric: Mood and affect are normal. Speech and behavior are normal. Patient exhibits appropriate insight and judgment   ___________________________________________   LABS (all labs ordered are listed, but only abnormal results are displayed)  Labs Reviewed - No data to display  PROCEDURES Procedures   INITIAL IMPRESSION / ASSESSMENT AND PLAN / ED COURSE  Pertinent labs & imaging results that were available during my care of the patient were reviewed by me and considered in my medical decision making (see chart for details).  Well-appearing patient.  No acute distress.  Suspect straining injury from recent exercises with secondary sciatica.  Recommend ice, supportive care and avoidance of strenuous activity.  Will treat with Mobic and as needed Flexeril.Discussed indication, risks and benefits of medications with patient.   Discussed follow up with Primary care physician this week as needed. Discussed follow up and return parameters including no resolution or any worsening concerns. Patient verbalized understanding and agreed to plan.   ____________________________________________   FINAL CLINICAL IMPRESSION(S) / ED  DIAGNOSES  Final diagnoses:  Acute right-sided low back pain with right-sided sciatica     ED Discharge Orders         Ordered    meloxicam (MOBIC) 15 MG tablet  Daily PRN     08/11/19 1958    cyclobenzaprine (FLEXERIL) 10 MG tablet  2 times daily PRN     08/11/19 1958           Note: This dictation was prepared with Dragon dictation along with smaller phrase technology. Any transcriptional errors that result from this process are unintentional.         Marylene Land, NP 08/11/19 2013

## 2019-08-11 NOTE — Discharge Instructions (Addendum)
Take medication as prescribed. Rest. Drink plenty of fluids. Ice. Stretch. Avoid strenuous activity.  Follow up with your primary care physician this week as needed. Return to Urgent care for new or worsening concerns.

## 2019-08-11 NOTE — ED Triage Notes (Signed)
Pt. States since Saturday she woke up with back pain, she thinks she may have pulled a muscle. She is currently seeing a Neurologist for her muscular pain all over her body.

## 2020-01-05 ENCOUNTER — Other Ambulatory Visit: Payer: Self-pay | Admitting: Acute Care

## 2020-01-05 DIAGNOSIS — G35 Multiple sclerosis: Secondary | ICD-10-CM

## 2020-01-16 ENCOUNTER — Ambulatory Visit: Payer: Managed Care, Other (non HMO)

## 2020-01-24 ENCOUNTER — Other Ambulatory Visit: Payer: Self-pay

## 2020-01-24 ENCOUNTER — Ambulatory Visit
Admission: RE | Admit: 2020-01-24 | Discharge: 2020-01-24 | Disposition: A | Discharge status: Home / Self Care | Payer: Managed Care, Other (non HMO) | Source: Ambulatory Visit | Attending: Acute Care | Admitting: Acute Care

## 2020-01-24 DIAGNOSIS — G35 Multiple sclerosis: Secondary | ICD-10-CM | POA: Diagnosis present

## 2020-01-24 MED ORDER — GADOBUTROL 1 MMOL/ML IV SOLN
7.0000 mL | Freq: Once | INTRAVENOUS | Status: AC | PRN
  Administered 2020-01-24: 15:00:00 7 mL via INTRAVENOUS

## 2020-01-31 ENCOUNTER — Other Ambulatory Visit: Payer: Self-pay

## 2020-01-31 ENCOUNTER — Ambulatory Visit (INDEPENDENT_AMBULATORY_CARE_PROVIDER_SITE_OTHER)
Admission: RE | Admit: 2020-01-31 | Discharge: 2020-01-31 | Disposition: A | Discharge status: Other Acute Inpatient Hospital | Payer: Managed Care, Other (non HMO) | Source: Ambulatory Visit

## 2020-01-31 ENCOUNTER — Ambulatory Visit
Admission: EM | Admit: 2020-01-31 | Discharge: 2020-01-31 | Disposition: A | Discharge status: Home / Self Care | Payer: Managed Care, Other (non HMO) | Attending: Family Medicine | Admitting: Family Medicine

## 2020-01-31 DIAGNOSIS — Z20822 Contact with and (suspected) exposure to covid-19: Secondary | ICD-10-CM | POA: Insufficient documentation

## 2020-01-31 DIAGNOSIS — F419 Anxiety disorder, unspecified: Secondary | ICD-10-CM | POA: Diagnosis not present

## 2020-01-31 DIAGNOSIS — J069 Acute upper respiratory infection, unspecified: Secondary | ICD-10-CM

## 2020-01-31 DIAGNOSIS — Z79899 Other long term (current) drug therapy: Secondary | ICD-10-CM | POA: Insufficient documentation

## 2020-01-31 DIAGNOSIS — J029 Acute pharyngitis, unspecified: Secondary | ICD-10-CM

## 2020-01-31 DIAGNOSIS — F329 Major depressive disorder, single episode, unspecified: Secondary | ICD-10-CM | POA: Insufficient documentation

## 2020-01-31 LAB — SARS CORONAVIRUS 2 (TAT 6-24 HRS): SARS Coronavirus 2: NEGATIVE

## 2020-01-31 LAB — GROUP A STREP BY PCR: Group A Strep by PCR: NOT DETECTED

## 2020-01-31 NOTE — ED Triage Notes (Signed)
Pt reports nasal congestion starting 5 days ago then progressed to sore throat and cough x 3-4 days.  Reports HA, body aches.  Denies SOB.  Unsure of fever.   No known exposure.

## 2020-01-31 NOTE — ED Provider Notes (Signed)
[facility]    CSN: [id number] Arrival date & time: 01/31/20  0913      History   Chief Complaint Chief Complaint  Patient presents with  . Sore Throat    HPI Cristina Allen is a 34 y.o. female.   34 yo female with a c/o nasal congestion, sore throat and cough for the past 3-4 days, associated with body aches and headaches. Denies any shortness of breath, fevers, chills. No known sick contacts.    Sore Throat    Past Medical History:  Diagnosis Date  . Anxiety and depression   . GERD (gastroesophageal reflux disease)   . Psoriasis of scalp     Patient Active Problem List   Diagnosis Date Noted  . Psoriasis 08/05/2017  . GAD (generalized anxiety disorder) 08/05/2017    Past Surgical History:  Procedure Laterality Date  . DILATION AND CURETTAGE OF UTERUS  2006  . TONSILLECTOMY      OB History   No obstetric history on file.      Home Medications    Prior to Admission medications   Medication Sig Start Date End Date Taking? Authorizing Provider  escitalopram (LEXAPRO) 20 MG tablet Take 1 tablet (20 mg total) by mouth daily. MUST SCHEDULE ANNUAL PHYSICAL 03/19/18  Yes Baity, Coralie Keens, NP  ALPRAZolam Duanne Moron) 0.25 MG tablet TAKE 1 TABLET BY MOUTH ONCE DAILY AS NEEDED FOR ANXIETY 03/19/18   Jearld Fenton, NP  cyclobenzaprine (FLEXERIL) 10 MG tablet Take 1 tablet (10 mg total) by mouth 2 (two) times daily as needed for muscle spasms. Do not drive while taking as can cause drowsiness 08/11/19   Marylene Land, NP  meloxicam (MOBIC) 15 MG tablet Take 1 tablet (15 mg total) by mouth daily as needed. 08/11/19   Marylene Land, NP    Family History Family History  Problem Relation Age of Onset  . Diabetes Maternal Grandfather   . Healthy Mother   . Healthy Father   . Cancer Neg Hx   . Stroke Neg Hx   . Heart disease Neg Hx     Social History Social History   Tobacco Use  . Smoking status: Never Smoker  . Smokeless tobacco: Never Used    Substance Use Topics  . Alcohol use: Yes    Comment: occasional  . Drug use: No     Allergies   Patient has no known allergies.   Review of Systems Review of Systems   Physical Exam Triage Vital Signs ED Triage Vitals  Enc Vitals Group     BP 01/31/20 0951 (!) 144/103     Pulse Rate 01/31/20 0951 76     Resp 01/31/20 0951 18     Temp 01/31/20 0951 97.9 F (36.6 C)     Temp Source 01/31/20 0951 Oral     SpO2 01/31/20 0951 99 %     Weight --      Height --      Head Circumference --      Peak Flow --      Pain Score 01/31/20 0946 6     Pain Loc --      Pain Edu? --      Excl. in [place]? --    No data found.  Updated Vital Signs BP (!) 144/103 (BP Location: Left Arm)   Pulse 76   Temp 97.9 F (36.6 C) (Oral)   Resp 18   LMP  (Within Months) Comment: about one month ago  SpO2 99%   Visual Acuity Right Eye Distance:   Left Eye Distance:   Bilateral Distance:    Right Eye Near:   Left Eye Near:    Bilateral Near:     Physical Exam Vitals and nursing note reviewed.  Constitutional:      General: She is not in acute distress.    Appearance: She is not toxic-appearing or diaphoretic.  HENT:     Right Ear: Tympanic membrane normal.     Left Ear: Tympanic membrane normal.     Mouth/Throat:     Pharynx: Posterior oropharyngeal erythema present. No oropharyngeal exudate.  Cardiovascular:     Rate and Rhythm: Normal rate.     Heart sounds: Normal heart sounds.  Pulmonary:     Effort: Pulmonary effort is normal. No respiratory distress.     Breath sounds: Normal breath sounds.  Neurological:     Mental Status: She is alert.      UC Treatments / Results  Labs (all labs ordered are listed, but only abnormal results are displayed) Labs Reviewed  GROUP A STREP BY PCR  SARS CORONAVIRUS 2 (TAT 6-24 HRS)    EKG   Radiology No results found.  Procedures Procedures (including critical care time)  Medications Ordered in UC Medications - No data to  display  Initial Impression / Assessment and Plan / UC Course  I have reviewed the triage vital signs and the nursing notes.  Pertinent labs & imaging results that were available during my care of the patient were reviewed by me and considered in my medical decision making (see chart for details).      Final Clinical Impressions(s) / UC Diagnoses   Final diagnoses:  Viral URI with cough     Discharge Instructions     Rest, fluids, over the counter medications as needed    ED Prescriptions    None     1. diagnosis reviewed with patient 2. Recommend supportive treatment as above  3. Follow-up prn if symptoms worsen or don't improve   PDMP not reviewed this encounter.   Norval Gable, MD 01/31/20 [phone]

## 2020-01-31 NOTE — Discharge Instructions (Signed)
Rest, fluids, over the counter medications as needed  

## 2020-01-31 NOTE — ED Provider Notes (Signed)
Virtual Visit via Video Note:  Cristina Allen  initiated request for Telemedicine visit with [facility] team. I connected with Cristina Allen  on 01/31/2020 at 8:36 AM  for a synchronized telemedicine visit using a video enabled HIPPA compliant telemedicine application. I verified that I am speaking with Cristina Allen  using two identifiers. Cristina Balloon, Cristina Allen  was physically located in a [facility] Urgent care site and Cristina Allen was located at a different location.   The limitations of evaluation and management by telemedicine as well as the availability of in-person appointments were discussed. Patient was informed that she  may incur a bill ( including co-pay) for this virtual visit encounter. Cristina Allen  expressed understanding and gave verbal consent to proceed with virtual visit.     History of Present Illness:Cristina Allen  is a 34 y.o. female presents for evaluation of sore throat and voice hoarse x 3 days.  She reports intermittent nasal congestion x 1 week.  No fever or chills.    No Known Allergies   Past Medical History:  Diagnosis Date  . Anxiety and depression   . GERD (gastroesophageal reflux disease)   . Psoriasis of scalp      Social History   Tobacco Use  . Smoking status: Never Smoker  . Smokeless tobacco: Never Used  Substance Use Topics  . Alcohol use: Yes    Comment: occasional  . Drug use: No        Observations/Objective: Physical Exam  VITALS: Patient denies fever. GENERAL: Alert, appears well and in no acute distress. HEENT: Atraumatic.  Voice hoarse.   NECK: Normal movements of the head and neck. CARDIOPULMONARY: No increased WOB. Speaking in clear sentences. I:E ratio WNL.  MS: Moves all visible extremities without noticeable abnormality. PSYCH: Pleasant and cooperative, well-groomed. Speech normal rate and rhythm. Affect is appropriate. Insight and judgement are appropriate. Attention is focused, linear, and  appropriate.  NEURO: CN grossly intact. Oriented as arrived to appointment on time with no prompting. Moves both UE equally.  SKIN: No obvious lesions, wounds, erythema, or cyanosis noted on face or hands.   Assessment and Plan:    ICD-10-CM   1. Sore throat  J02.9        Follow Up Instructions: Instructed patient to come for in person evaluation of her sore throat.  She agrees.      I discussed the assessment and treatment plan with the patient. The patient was provided an opportunity to ask questions and all were answered. The patient agreed with the plan and demonstrated an understanding of the instructions.   The patient was advised to call back or seek an in-person evaluation if the symptoms worsen or if the condition fails to improve as anticipated.      Cristina Balloon, Cristina Allen  01/31/2020 8:36 AM         Cristina Balloon, Cristina Allen 01/31/20 [phone]

## 2020-01-31 NOTE — Discharge Instructions (Addendum)
Come to the Urgent Care for in-person evaluation of your sore throat.

## 2020-12-13 IMAGING — MR MR HEAD WO/W CM
15 series · 48 of 48 positions shown · IV contrast (gadavist)
Comparison: Head CT 02/05/2005.

CLINICAL DATA: 33-year-old female with right arm pain, numbness and
tingling for 6 months. No known injury. Query MS.

EXAM:
MRI HEAD WITHOUT AND WITH CONTRAST
TECHNIQUE: Multiplanar, multiecho pulse sequences of the brain and surrounding
structures were obtained without and with intravenous contrast.
CONTRAST:  7mL GADAVIST GADOBUTROL 1 MMOL/ML IV SOLN

[Series 5: ax dwi_tracew · axial · 3.0mm · 0.60mm/px · z∈[-156,-11]mm · 4 of 48 slices shown]
[im 1/48]
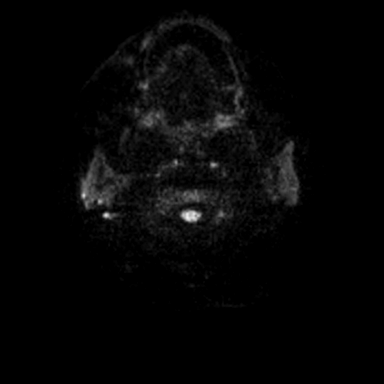
[im 16/48]
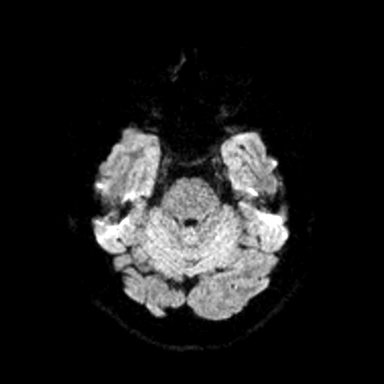
[im 32/48]
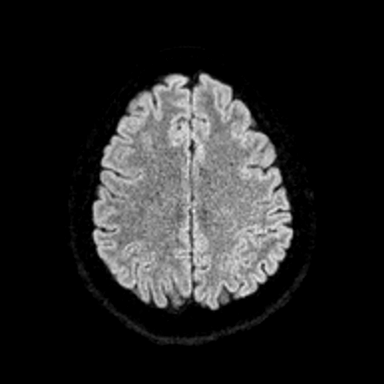
[im 48/48]
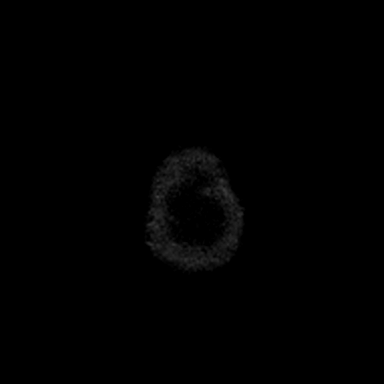

[Series 6: ax dwi_adc · axial · 3.0mm · 0.60mm/px · z∈[-156,-11]mm · 3 of 48 slices shown]
[im 1/48]
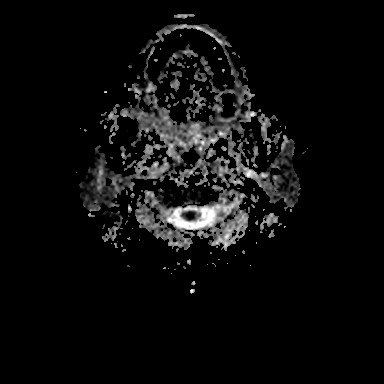
[im 24/48]
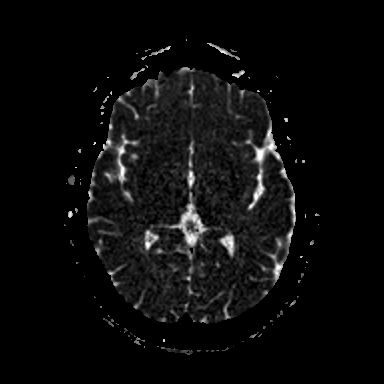
[im 48/48]
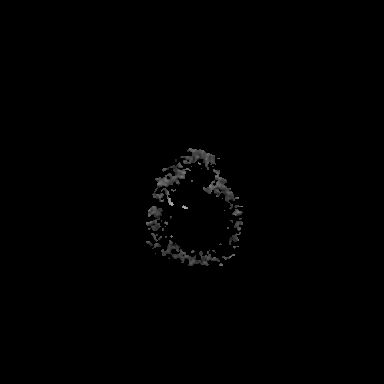

[Series 7: cor dwi_tracew · coronal · 5.0mm · 0.60mm/px · 2 of 38 slices shown]
[im 1/38]
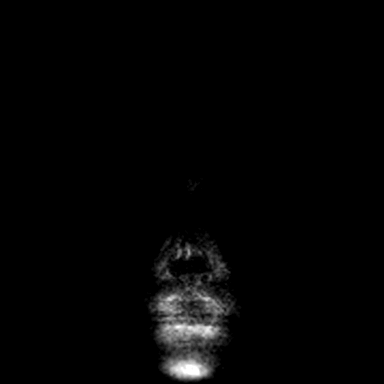
[im 38/38]
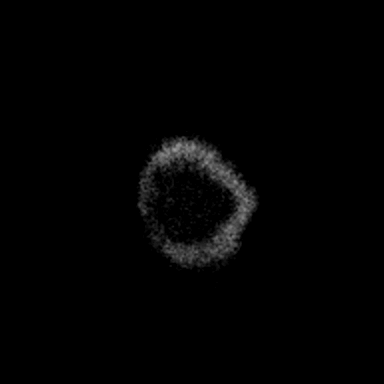

[Series 8: cor dwi_adc · coronal · 5.0mm · 0.60mm/px · 2 of 38 slices shown]
[im 1/38]
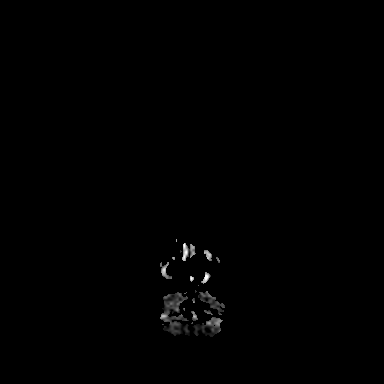
[im 38/38]
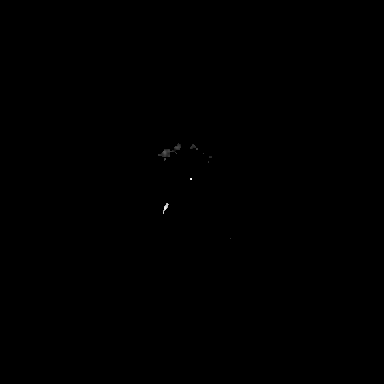

[Series 9: T1 · sagittal · 5.0mm · 0.62mm/px · 1 of 25 slices shown (1 of 2)]
[im 1/25]
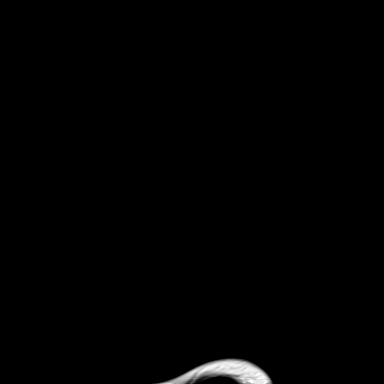

[Series 11: T2 · axial · 5.0mm · 0.53mm/px · 1 of 27 slices shown]
[im 1/27]
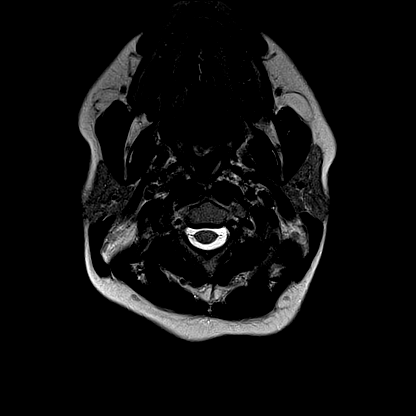

[Series 12: mag_images · axial · 3.0mm · 0.90mm/px · z∈[-170,-6]mm · 3 of 60 slices shown]
[im 1/60]
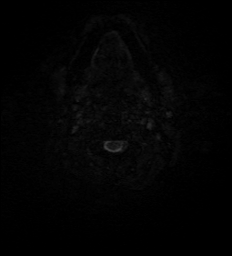
[im 30/60]
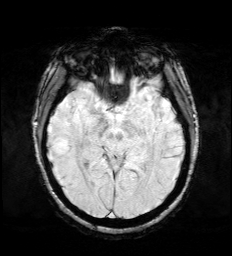
[im 60/60]
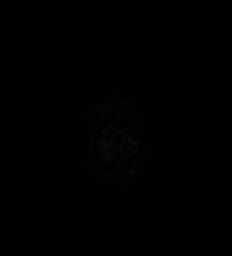

[Series 13: pha_images · axial · 3.0mm · 0.90mm/px · z∈[-170,-8]mm · 3 of 59 slices shown]
[im 1/59]
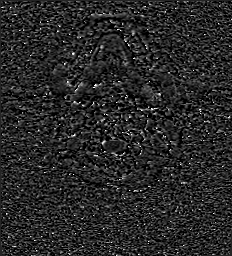
[im 30/59]
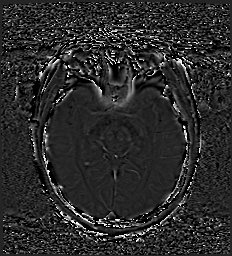
[im 59/59]
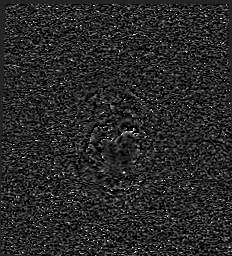

[Series 14: swi_images · axial · 3.0mm · 0.90mm/px · z∈[-170,-6]mm · 3 of 60 slices shown]
[im 1/60]
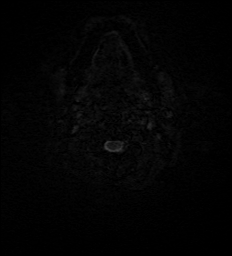
[im 30/60]
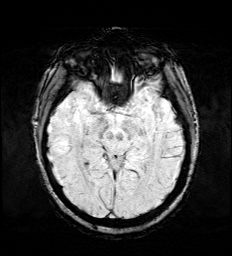
[im 60/60]
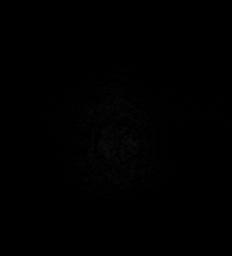

[Series 16: FLAIR · axial · 3.0mm · 0.53mm/px · z∈[-162,-11]mm · 3 of 55 slices shown (1 of 2)]
[im 1/55]
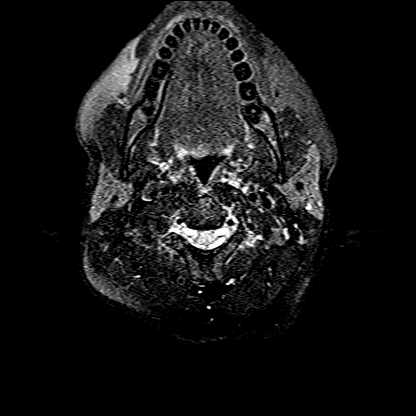
[im 28/55]
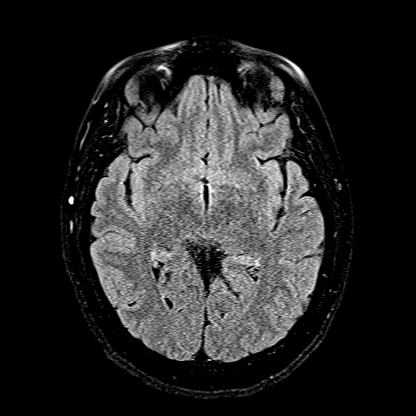
[im 55/55]
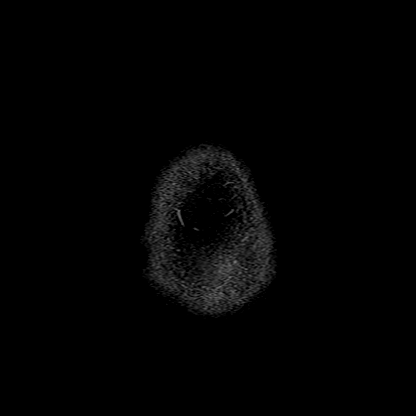

[Series 17: T1 · axial · 1.0mm · 0.98mm/px · z∈[-167,-5]mm · 9 of 176 slices shown (2 of 2)]
[im 1/176]
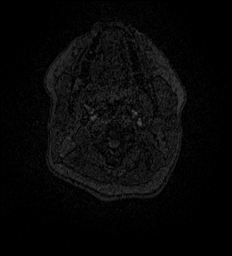
[im 22/176]
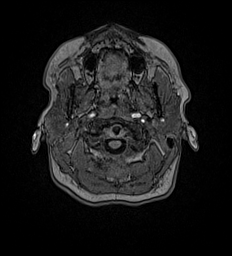
[im 44/176]
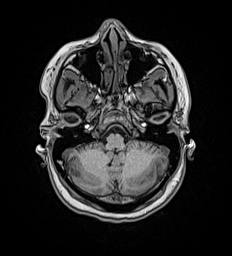
[im 66/176]
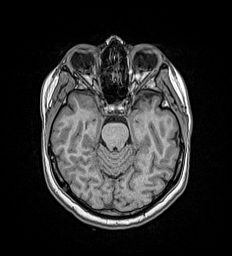
[im 88/176]
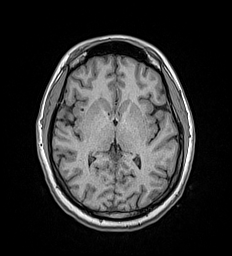
[im 110/176]
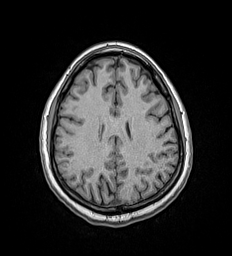
[im 132/176]
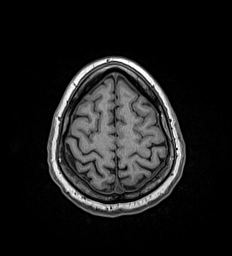
[im 154/176]
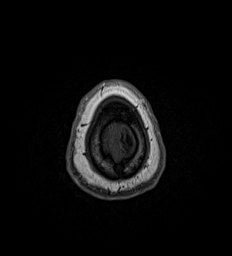
[im 176/176]
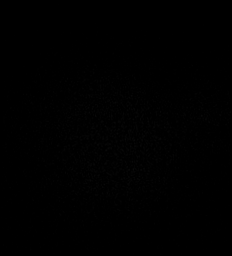

[Series 18: FLAIR · sagittal · 5.0mm · 0.94mm/px · 1 of 24 slices shown (2 of 2)]
[im 1/24]
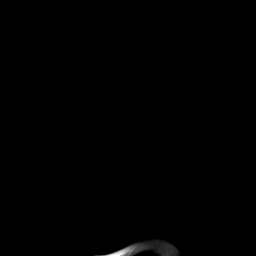

[Series 19: T2 post-contrast · coronal · 5.0mm · 0.57mm/px · 2 of 29 slices shown]
[im 1/29]
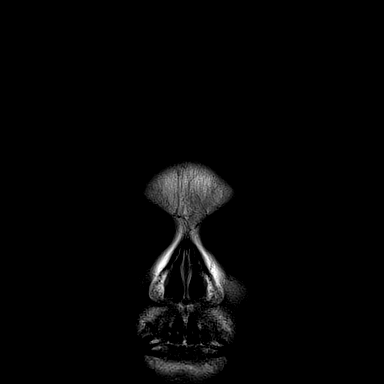
[im 29/29]
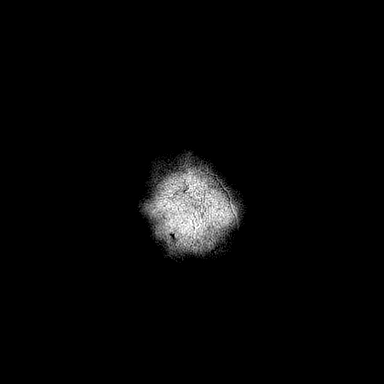

[Series 20: T1 post-contrast · axial · 1.0mm · 0.98mm/px · z∈[-167,-5]mm · 9 of 176 slices shown (1 of 2)]
[im 1/176]
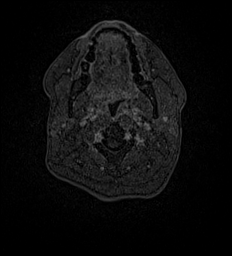
[im 22/176]
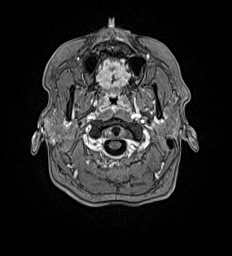
[im 44/176]
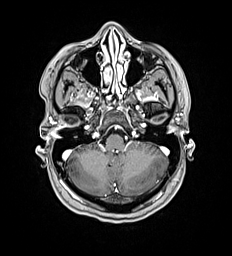
[im 66/176]
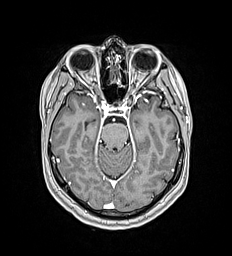
[im 88/176]
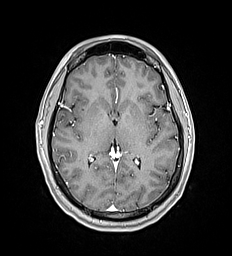
[im 110/176]
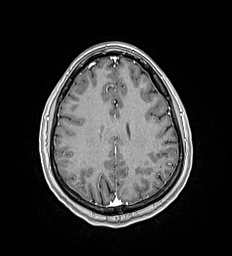
[im 132/176]
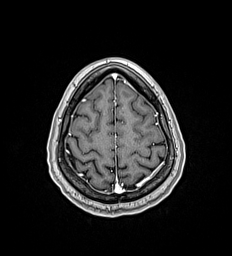
[im 154/176]
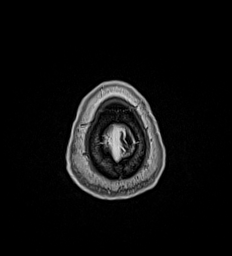
[im 176/176]
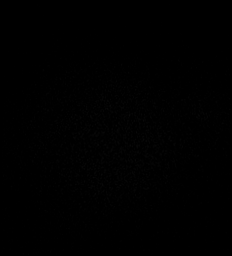

[Series 21: T1 post-contrast · coronal · 5.0mm · 0.57mm/px · 2 of 29 slices shown (2 of 2)]
[im 1/29]
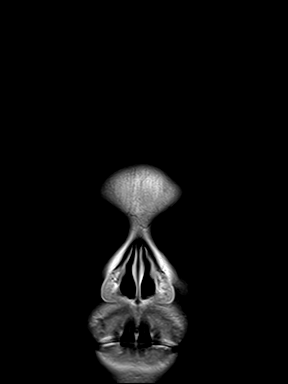
[im 29/29]
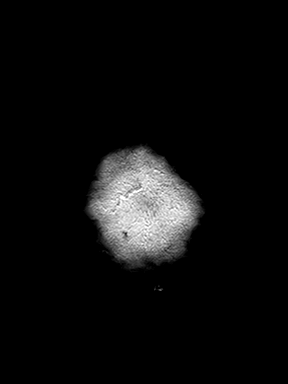

[48 of 48 positions shown; findings below may reference images not displayed]

FINDINGS: Brain: Normal cerebral volume. No restricted diffusion to suggest
acute infarction. No midline shift, mass effect, evidence of mass
lesion, ventriculomegaly, extra-axial collection or acute
intracranial hemorrhage. Cervicomedullary junction and pituitary are
within normal limits.

Gray and white matter signal is within normal limits throughout the
brain. No white matter lesions identified. No encephalomalacia or
chronic cerebral blood products identified.

No abnormal enhancement identified. No dural thickening.

Vascular: Major intracranial vascular flow voids are preserved. The
major dural venous sinuses are enhancing and appear to be patent.

Skull and upper cervical spine: Negative visible cervical spine,
bone marrow signal.

Sinuses/Orbits: Orbits soft tissues appears symmetric and normal.
Paranasal sinuses are clear.

Other: Mastoids are clear. Visible internal auditory structures
appear normal. Scalp and face soft tissues are within normal limits.
There is physiologic appearing hypertrophy and enhancement of the
adenoids.
IMPRESSION: Normal MRI appearance of the brain.

## 2021-09-29 ENCOUNTER — Emergency Department
Admission: EM | Admit: 2021-09-29 | Discharge: 2021-09-29 | Disposition: A | Discharge status: Home / Self Care | Payer: Managed Care, Other (non HMO) | Attending: Emergency Medicine | Admitting: Emergency Medicine

## 2021-09-29 DIAGNOSIS — Z008 Encounter for other general examination: Secondary | ICD-10-CM

## 2021-09-29 NOTE — Discharge Instructions (Signed)
Return to the ER for any concerns, chest pain, shortness of breath or persistent vomiting.

## 2021-09-29 NOTE — ED Triage Notes (Signed)
Pt is with Police and needs medical clearance. No complaints.

## 2021-09-29 NOTE — ED Provider Notes (Signed)
[facility] Emergency Department Provider Note   ____________________________________________   Event Date/Time   First MD Initiated Contact with Patient 09/29/21 0502     (approximate)  I have reviewed the triage vital signs and the nursing notes.   HISTORY  Chief Complaint Medical clearance for jail/forensic blood draw    HPI Cristina Allen is a 35 y.o. female brought to the ED by [employer] for medical clearance for jail/forensic blood draw.  Patient denies pain or injury.  Voices no medical complaints.  She is cooperative.     Past Medical History:  Diagnosis Date   Anxiety and depression    GERD (gastroesophageal reflux disease)    Psoriasis of scalp     Patient Active Problem List   Diagnosis Date Noted   Psoriasis 08/05/2017   GAD (generalized anxiety disorder) 08/05/2017    Past Surgical History:  Procedure Laterality Date   DILATION AND CURETTAGE OF UTERUS  2006   TONSILLECTOMY      Prior to Admission medications   Medication Sig Start Date End Date Taking? Authorizing Provider  ALPRAZolam Duanne Moron) 0.25 MG tablet TAKE 1 TABLET BY MOUTH ONCE DAILY AS NEEDED FOR ANXIETY 03/19/18   Jearld Fenton, NP  cyclobenzaprine (FLEXERIL) 10 MG tablet Take 1 tablet (10 mg total) by mouth 2 (two) times daily as needed for muscle spasms. Do not drive while taking as can cause drowsiness 08/11/19   Marylene Land, NP  escitalopram (LEXAPRO) 20 MG tablet Take 1 tablet (20 mg total) by mouth daily. MUST SCHEDULE ANNUAL PHYSICAL 03/19/18   Jearld Fenton, NP  meloxicam (MOBIC) 15 MG tablet Take 1 tablet (15 mg total) by mouth daily as needed. 08/11/19   Marylene Land, NP    Allergies Patient has no known allergies.  Family History  Problem Relation Age of Onset   Diabetes Maternal Grandfather    Healthy Mother    Healthy Father    Cancer Neg Hx    Stroke Neg Hx    Heart disease Neg Hx     Social History Social History   Tobacco Use    Smoking status: Never   Smokeless tobacco: Never  Vaping Use   Vaping Use: Never used  Substance Use Topics   Alcohol use: Yes    Comment: occasional   Drug use: No    Review of Systems  Constitutional: No fever/chills Eyes: No visual changes. ENT: No sore throat. Cardiovascular: Denies chest pain. Respiratory: Denies shortness of breath. Gastrointestinal: No abdominal pain.  No nausea, no vomiting.  No diarrhea.  No constipation. Genitourinary: Negative for dysuria. Musculoskeletal: Negative for back pain. Skin: Negative for rash. Neurological: Negative for headaches, focal weakness or numbness.   ____________________________________________   PHYSICAL EXAM:  VITAL SIGNS: ED Triage Vitals  Enc Vitals Group     BP      Pulse      Resp      Temp      Temp src      SpO2      Weight      Height      Head Circumference      Peak Flow      Pain Score      Pain Loc      Pain Edu?      Excl. in [place]?     Constitutional: Alert and oriented. Well appearing and in no acute distress. Eyes: Conjunctivae are normal. PERRL. EOMI. Head: Atraumatic. Nose: Atraumatic. Mouth/Throat: Mucous  membranes are moist.  No dental malocclusion.  Neck: No stridor.  No cervical spine tenderness to palpation. Cardiovascular: Normal rate, regular rhythm. Grossly normal heart sounds.  Good peripheral circulation. Respiratory: Normal respiratory effort.  No retractions. Lungs CTAB. Gastrointestinal: Soft and nontender. No distention. No abdominal bruits. No CVA tenderness. Musculoskeletal: No lower extremity tenderness nor edema.  No joint effusions. Neurologic: Alert and oriented x3.  CN II-XII grossly intact.  Normal speech and language. No gross focal neurologic deficits are appreciated. No gait instability. Skin:  Skin is warm, dry and intact. No rash noted. Psychiatric: Mood and affect are normal. Speech and behavior are normal.  ____________________________________________    LABS (all labs ordered are listed, but only abnormal results are displayed)  Labs Reviewed - No data to display ____________________________________________  EKG  None ____________________________________________  RADIOLOGY I, Khyli Swaim J, personally viewed and evaluated these images (plain radiographs) as part of my medical decision making, as well as reviewing the written report by the radiologist.  ED MD interpretation: None  Official radiology report(s): No results found.  ____________________________________________   PROCEDURES  Procedure(s) performed (including Critical Care):  Procedures   ____________________________________________   INITIAL IMPRESSION / ASSESSMENT AND PLAN / ED COURSE  As part of my medical decision making, I reviewed the following data within the [facility] notes reviewed and incorporated, Old chart reviewed, and Notes from prior ED visits     35 year old female brought to the ED for medical clearance for jail/monitor blood draw.  Voices no medical complaints or injuries.  She is medically cleared for incarceration.  Strict return precautions given.  Patient verbalizes understanding and agrees with plan of care.      ____________________________________________   FINAL CLINICAL IMPRESSION(S) / ED DIAGNOSES  Final diagnoses:  Medical clearance for incarceration     ED Discharge Orders     None        Note:  This document was prepared using Dragon voice recognition software and may include unintentional dictation errors.    Paulette Blanch, MD 09/29/21 [phone]

## 2022-03-07 ENCOUNTER — Other Ambulatory Visit: Payer: Self-pay | Admitting: Obstetrics and Gynecology

## 2022-03-07 DIAGNOSIS — O039 Complete or unspecified spontaneous abortion without complication: Secondary | ICD-10-CM

## 2022-03-12 ENCOUNTER — Ambulatory Visit
Admission: RE | Admit: 2022-03-12 | Discharge: 2022-03-12 | Disposition: A | Discharge status: Home / Self Care | Payer: Self-pay | Source: Ambulatory Visit | Attending: Obstetrics and Gynecology | Admitting: Obstetrics and Gynecology

## 2022-03-12 ENCOUNTER — Other Ambulatory Visit: Payer: Self-pay | Admitting: Obstetrics and Gynecology

## 2022-03-12 DIAGNOSIS — O039 Complete or unspecified spontaneous abortion without complication: Secondary | ICD-10-CM

## 2022-04-03 ENCOUNTER — Emergency Department
Admission: EM | Admit: 2022-04-03 | Discharge: 2022-04-04 | Disposition: A | Discharge status: Home / Self Care | Payer: No Typology Code available for payment source | Attending: Emergency Medicine | Admitting: Emergency Medicine

## 2022-04-03 ENCOUNTER — Emergency Department: Payer: No Typology Code available for payment source

## 2022-04-03 ENCOUNTER — Other Ambulatory Visit: Payer: Self-pay

## 2022-04-03 DIAGNOSIS — Z3A01 Less than 8 weeks gestation of pregnancy: Secondary | ICD-10-CM | POA: Insufficient documentation

## 2022-04-03 DIAGNOSIS — O209 Hemorrhage in early pregnancy, unspecified: Secondary | ICD-10-CM | POA: Diagnosis present

## 2022-04-03 DIAGNOSIS — O4690 Antepartum hemorrhage, unspecified, unspecified trimester: Secondary | ICD-10-CM

## 2022-04-03 LAB — CBC WITH DIFFERENTIAL/PLATELET
Abs Immature Granulocytes: 0.02 10*3/uL (ref 0.00–0.07)
Basophils Absolute: 0 10*3/uL (ref 0.0–0.1)
Basophils Relative: 0 %
Eosinophils Absolute: 0.3 10*3/uL (ref 0.0–0.5)
Eosinophils Relative: 4 %
HCT: 28.8 % — ABNORMAL LOW (ref 36.0–46.0)
Hemoglobin: 8.9 g/dL — ABNORMAL LOW (ref 12.0–15.0)
Immature Granulocytes: 0 %
Lymphocytes Relative: 21 %
Lymphs Abs: 1.6 10*3/uL (ref 0.7–4.0)
MCH: 20.2 pg — ABNORMAL LOW (ref 26.0–34.0)
MCHC: 30.9 g/dL (ref 30.0–36.0)
MCV: 65.5 fL — ABNORMAL LOW (ref 80.0–100.0)
Monocytes Absolute: 0.4 10*3/uL (ref 0.1–1.0)
Monocytes Relative: 5 %
Neutro Abs: 5.3 10*3/uL (ref 1.7–7.7)
Neutrophils Relative %: 70 %
Platelets: 261 10*3/uL (ref 150–400)
RBC: 4.4 MIL/uL (ref 3.87–5.11)
RDW: 18.7 % — ABNORMAL HIGH (ref 11.5–15.5)
WBC: 7.6 10*3/uL (ref 4.0–10.5)
nRBC: 0 % (ref 0.0–0.2)

## 2022-04-03 LAB — HCG, QUANTITATIVE, PREGNANCY: hCG, Beta Chain, Quant, S: 216938 m[IU]/mL — ABNORMAL HIGH (ref <5)

## 2022-04-03 LAB — BASIC METABOLIC PANEL
Anion gap: 8 (ref 5–15)
BUN: 7 mg/dL (ref 6–20)
CO2: 21 mmol/L — ABNORMAL LOW (ref 22–32)
Calcium: 8.5 mg/dL — ABNORMAL LOW (ref 8.9–10.3)
Chloride: 107 mmol/L (ref 98–111)
Creatinine, Ser: 0.68 mg/dL (ref 0.44–1.00)
GFR, Estimated: >60 mL/min (ref >60)
Glucose, Bld: 118 mg/dL — ABNORMAL HIGH (ref 70–99)
Potassium: 3.7 mmol/L (ref 3.5–5.1)
Sodium: 136 mmol/L (ref 135–145)

## 2022-04-03 LAB — ABO/RH: ABO/RH(D): O POS

## 2022-04-03 NOTE — ED Triage Notes (Signed)
Pt states she started having vaginal bleeding and cramping Thursday- pt states the amount of blood varies by day- pt states she is approximately 10 weeks- pt has had 2 prior miscarriages

## 2022-04-03 NOTE — ED Provider Triage Note (Signed)
Emergency Medicine Provider Triage Evaluation Note  Cristina Allen , a 36 y.o. female  was evaluated in triage.  Pt complains of vaginal bleeding. G4P3 currently [redacted] weeks pregnant, cramping and bleeding since Thursday. Not needing pads. This is her third miscarriage  Review of Systems  Positive: Vag bleeding, cramping Negative: dizziness  Physical Exam  There were no vitals taken for this visit. Gen:   Awake, no distress   Resp:  Normal effort  MSK:   Moves extremities without difficulty  Other:    Medical Decision Making  Medically screening exam initiated at 6:18 PM.  Appropriate orders placed.  Cristina Allen was informed that the remainder of the evaluation will be completed by another provider, this initial triage assessment does not replace that evaluation, and the importance of remaining in the ED until their evaluation is complete.     Marquette Old, PA-C 04/03/22 1821

## 2022-04-03 NOTE — ED Provider Notes (Signed)
[facility] Provider Note    Event Date/Time   First MD Initiated Contact with Patient 04/03/22 2250     (approximate)   History   Vaginal Bleeding   HPI  Bonnetta Allbee is a 36 y.o. female presents to the ER with vaginal bleeding in early pregnancy.  Roughly 10 weeks by LMP.  States that she does notice some spotting after having intercourse on Thursday.  States the bleeding is on and off feels like a light period.  Brownish color.  No fevers.  Having some intermittent cramping.  No passage of fetal tissue.  Purulent discharge.  No dysuria.        Physical Exam   Triage Vital Signs: ED Triage Vitals  Enc Vitals Group     BP 04/03/22 1819 127/88     Pulse Rate 04/03/22 1819 96     Resp 04/03/22 1819 18     Temp 04/03/22 1819 98.3 F (36.8 C)     Temp Source 04/03/22 1819 Oral     SpO2 04/03/22 1819 98 %     Weight 04/03/22 1817 150 lb (68 kg)     Height 04/03/22 1817 '5\' 1"'$  (1.549 m)     Head Circumference --      Peak Flow --      Pain Score 04/03/22 1817 5     Pain Loc --      Pain Edu? --      Excl. in [place]? --     Most recent vital signs: Vitals:   04/03/22 1819  BP: 127/88  Pulse: 96  Resp: 18  Temp: 98.3 F (36.8 C)  SpO2: 98%     Constitutional: Alert  Eyes: Conjunctivae are normal.  Head: Atraumatic. Nose: No congestion/rhinnorhea. Mouth/Throat: Mucous membranes are moist.   Neck: Painless ROM.  Cardiovascular:   Good peripheral circulation. Respiratory: Normal respiratory effort.  No retractions.  Gastrointestinal: Soft and nontender.  Musculoskeletal:  no deformity Neurologic:  MAE spontaneously. No gross focal neurologic deficits are appreciated.  Skin:  Skin is warm, dry and intact. No rash noted. Psychiatric: Mood and affect are normal. Speech and behavior are normal.    ED Results / Procedures / Treatments   Labs (all labs ordered are listed, but only abnormal results are displayed) Labs Reviewed  CBC  WITH DIFFERENTIAL/PLATELET - Abnormal; Notable for the following components:      Result Value   Hemoglobin 8.9 (*)    HCT 28.8 (*)    MCV 65.5 (*)    MCH 20.2 (*)    RDW 18.7 (*)    All other components within normal limits  BASIC METABOLIC PANEL - Abnormal; Notable for the following components:   CO2 21 (*)    Glucose, Bld 118 (*)    Calcium 8.5 (*)    All other components within normal limits  HCG, QUANTITATIVE, PREGNANCY - Abnormal; Notable for the following components:   hCG, Beta Chain, Laqueta Carina 216,938 (*)    All other components within normal limits  URINALYSIS, ROUTINE W REFLEX MICROSCOPIC  ABO/RH     EKG     RADIOLOGY Please see ED Course for my review and interpretation.  I personally reviewed all radiographic images ordered to evaluate for the above acute complaints and reviewed radiology reports and findings.  These findings were personally discussed with the patient.  Please see medical record for radiology report.    PROCEDURES:  Critical Care performed:   Procedures   MEDICATIONS  ORDERED IN ED: Medications - No data to display   IMPRESSION / MDM / [place] / ED COURSE  I reviewed the triage vital signs and the nursing notes.                              Differential diagnosis includes, but is not limited to, ectopic, AUB, DU B, threatened miscarriage, subchorionic hematoma  Patient presented to the ER for evaluation of vaginal bleeding in early pregnancy.  She is hemodynamically stable she does have chronic anemia and on review of care everywhere her baseline hemoglobin is 9.  She is Rh+.  Ultrasound ordered for the above differential does show IUP with reassuring fetal heart tones w/ reassuring progression of this pregnancy.  At this point given benign exam reassuring work-up does appear stable for outpatient follow-up.  We just discussed need for starting prenatal vitamins as well as iron supplementation.      Patient's presentation  is most consistent with    FINAL CLINICAL IMPRESSION(S) / ED DIAGNOSES   Final diagnoses:  Vaginal bleeding in pregnancy     Rx / DC Orders   ED Discharge Orders     None        Note:  This document was prepared using Dragon voice recognition software and may include unintentional dictation errors.    Merlyn Lot, MD 04/03/22 [phone]

## 2022-04-04 NOTE — ED Notes (Signed)
E-signature pad unavailable - Pt verbalized understanding of D/C information - no additional concerns at this time.  

## 2022-04-08 ENCOUNTER — Ambulatory Visit (INDEPENDENT_AMBULATORY_CARE_PROVIDER_SITE_OTHER): Payer: Self-pay

## 2022-04-08 DIAGNOSIS — O0990 Supervision of high risk pregnancy, unspecified, unspecified trimester: Secondary | ICD-10-CM

## 2022-04-08 DIAGNOSIS — O09529 Supervision of elderly multigravida, unspecified trimester: Secondary | ICD-10-CM

## 2022-04-08 DIAGNOSIS — O09899 Supervision of other high risk pregnancies, unspecified trimester: Secondary | ICD-10-CM

## 2022-04-08 DIAGNOSIS — Z3A00 Weeks of gestation of pregnancy not specified: Secondary | ICD-10-CM

## 2022-04-08 DIAGNOSIS — Z3480 Encounter for supervision of other normal pregnancy, unspecified trimester: Secondary | ICD-10-CM | POA: Insufficient documentation

## 2022-04-08 DIAGNOSIS — O0940 Supervision of pregnancy with grand multiparity, unspecified trimester: Secondary | ICD-10-CM

## 2022-04-08 NOTE — Progress Notes (Signed)
New OB Intake  I connected with  Cristina Allen on 04/08/22 at 11:15 AM EDT by telephone and verified that I am speaking with the correct person using two identifiers. Nurse is located at [employer] and pt is located at home.  I explained I am completing New OB Intake today. We discussed her EDD of 10/26/2022 that is based on U/S on 04/03/22  Pt is G5/P0040. I reviewed her allergies, medications, Medical/Surgical/OB history, and appropriate screenings. Based on history, this is a/an pregnancy complicated by hx os 2 abortions, 2 miscarriages, fibroids. States she has an u/s scheduled to see where the fibroids are located and if they are responsible for the miscarriages ; subchorionic hemorrhage, advanced maternal age.  Patient Active Problem List   Diagnosis Date Noted   Supervision of other normal pregnancy, antepartum 04/08/2022   Psoriasis 08/05/2017   GAD (generalized anxiety disorder) 08/05/2017    Concerns addressed today None  Delivery Plans:  Plans to deliver at [facility].  Anatomy US Explained first scheduled [place] will be around 20 weeks.   Labs Discussed genetic screening with patient. Patient desires genetic testing to be drawn with new OB labs. Discussed possible labs to be drawn at new OB appointment.  COVID Vaccine Patient has not had COVID vaccine.   Social Determinants of Health Food Insecurity: expresses food insecurity. Information given on local food banks. Transportation: Patient denies transportation needs.    First visit review I reviewed new OB appt with pt. I explained she will have ob bloodwork and pap smear/pelvic exam if indicated. Explained pt will be seen by Rod Can, CNM at first visit; encounter routed to appropriate provider.   Cleophas Dunker, [facility] 04/08/2022  11:52 AM  Clinical Staff Provider  Office Location  [facility] Dating    Language  English Anatomy US    Flu Vaccine  offer Genetic Screen  NIPS:   TDaP vaccine   offer Hgb A1C or   GTT Early : Third trimester :   Covid declines   LAB RESULTS   Rhogam   Blood Type --/--/O POS Performed at [facility], [address] [employer], [place] [address]  [phone])   Feeding Plan breast Antibody    Contraception undecided Rubella    Circumcision yes RPR     Pediatrician  undecided HBsAg     Support Person Cristina Allen HIV    Prenatal Classes yes Varicella     GBS  (For PCN allergy, check sensitivities)   BTL Consent  Hep C   VBAC Consent  Pap      Hgb Electro      CF      SMA

## 2022-04-16 ENCOUNTER — Other Ambulatory Visit: Payer: Self-pay

## 2022-04-16 DIAGNOSIS — O0990 Supervision of high risk pregnancy, unspecified, unspecified trimester: Secondary | ICD-10-CM

## 2022-04-17 LAB — CBC/D/PLT+RPR+RH+ABO+RUBIGG...
Antibody Screen: NEGATIVE
Basophils Absolute: 0 10*3/uL (ref 0.0–0.2)
Basos: 0 %
EOS (ABSOLUTE): 0.2 10*3/uL (ref 0.0–0.4)
Eos: 3 %
HCV Ab: NONREACTIVE
HIV Screen 4th Generation wRfx: NONREACTIVE
Hematocrit: 28.7 % — ABNORMAL LOW (ref 34.0–46.6)
Hemoglobin: 8.8 g/dL — ABNORMAL LOW (ref 11.1–15.9)
Hepatitis B Surface Ag: NEGATIVE
Immature Grans (Abs): 0 10*3/uL (ref 0.0–0.1)
Immature Granulocytes: 0 %
Lymphocytes Absolute: 1.3 10*3/uL (ref 0.7–3.1)
Lymphs: 21 %
MCH: 20.5 pg — ABNORMAL LOW (ref 26.6–33.0)
MCHC: 30.7 g/dL — ABNORMAL LOW (ref 31.5–35.7)
MCV: 67 fL — ABNORMAL LOW (ref 79–97)
Monocytes Absolute: 0.4 10*3/uL (ref 0.1–0.9)
Monocytes: 6 %
Neutrophils Absolute: 4.5 10*3/uL (ref 1.4–7.0)
Neutrophils: 70 %
Platelets: 281 10*3/uL (ref 150–450)
RBC: 4.29 x10E6/uL (ref 3.77–5.28)
RDW: 19.3 % — ABNORMAL HIGH (ref 11.7–15.4)
RPR Ser Ql: NONREACTIVE
Rh Factor: POSITIVE
Rubella Antibodies, IGG: 10.5 index (ref >0.99)
Varicella zoster IgG: 3245 index (ref >165)
WBC: 6.5 10*3/uL (ref 3.4–10.8)

## 2022-04-17 LAB — HCV INTERPRETATION

## 2022-04-21 LAB — MATERNIT 21 PLUS CORE, BLOOD
Fetal Fraction: 8
Result (T21): NEGATIVE
Trisomy 13 (Patau syndrome): NEGATIVE
Trisomy 18 (Edwards syndrome): NEGATIVE
Trisomy 21 (Down syndrome): NEGATIVE

## 2022-04-24 ENCOUNTER — Encounter: Payer: Self-pay | Admitting: Advanced Practice Midwife

## 2022-04-24 ENCOUNTER — Ambulatory Visit (INDEPENDENT_AMBULATORY_CARE_PROVIDER_SITE_OTHER): Payer: No Typology Code available for payment source | Admitting: Advanced Practice Midwife

## 2022-04-24 ENCOUNTER — Other Ambulatory Visit (HOSPITAL_COMMUNITY)
Admission: RE | Admit: 2022-04-24 | Discharge: 2022-04-24 | Disposition: A | Discharge status: Home / Self Care | Payer: No Typology Code available for payment source | Source: Ambulatory Visit | Attending: Advanced Practice Midwife | Admitting: Advanced Practice Midwife

## 2022-04-24 VITALS — BP 130/80 | Wt 139.0 lb

## 2022-04-24 DIAGNOSIS — Z113 Encounter for screening for infections with a predominantly sexual mode of transmission: Secondary | ICD-10-CM

## 2022-04-24 DIAGNOSIS — Z3480 Encounter for supervision of other normal pregnancy, unspecified trimester: Secondary | ICD-10-CM

## 2022-04-24 DIAGNOSIS — Z3A13 13 weeks gestation of pregnancy: Secondary | ICD-10-CM

## 2022-04-24 DIAGNOSIS — Z130 Encounter for screening for diseases of the blood and blood-forming organs and certain disorders involving the immune mechanism: Secondary | ICD-10-CM

## 2022-04-24 DIAGNOSIS — Z369 Encounter for antenatal screening, unspecified: Secondary | ICD-10-CM

## 2022-04-24 LAB — POCT URINALYSIS DIPSTICK OB
Glucose, UA: NEGATIVE
POC,PROTEIN,UA: NEGATIVE

## 2022-04-24 MED ORDER — VITAPEARL 30-1.4-200 MG PO CPCR: 1.0000 | ORAL_CAPSULE | Freq: Every day | ORAL | 9 refills | Status: DC

## 2022-04-24 NOTE — Patient Instructions (Signed)
Morning Sickness  Morning sickness is when a woman feels nauseous during pregnancy. This nauseous feeling may or may not come with vomiting. It often occurs in the morning, but it can be a problem at any time of day. Morning sickness is most common during the first trimester. In some cases, it may continue throughout pregnancy. Although morning sickness is unpleasant, it is usually harmless unless the woman develops severe and continual vomiting (hyperemesis gravidarum), a condition that requires more intense treatment. What are the causes? The exact cause of this condition is not known, but it seems to be related to normal hormonal changes that occur in pregnancy. What increases the risk? You are more likely to develop this condition if: You experienced nausea or vomiting before your pregnancy. You had morning sickness during a previous pregnancy. You are pregnant with more than one baby, such as twins. What are the signs or symptoms? Symptoms of this condition include: Nausea. Vomiting. How is this diagnosed? This condition is usually diagnosed based on your signs and symptoms. How is this treated? In many cases, treatment is not needed for this condition. Making some changes to what you eat may help to control symptoms. Your health care provider may also prescribe or recommend: Vitamin B6 supplements. Anti-nausea medicines. Ginger. Follow these instructions at home: Medicines Take over-the-counter and prescription medicines only as told by your health care provider. Do not use any prescription, over-the-counter, or herbal medicines for morning sickness without first talking with your health care provider. Take multivitamins before getting pregnant. This can prevent or decrease the severity of morning sickness in most women. Eating and drinking Eat a piece of dry toast or crackers before getting out of bed in the morning. Eat 5 or 6 small meals a day. Eat dry and bland foods, such as  rice or a baked potato. Foods that are high in carbohydrates are often helpful. Avoid greasy, fatty, and spicy foods. Have someone cook for you if the smell of any food causes nausea and vomiting. If you feel nauseous after taking prenatal vitamins, take the vitamins at night or with a snack. Eat a protein snack between meals if you are hungry. Nuts, yogurt, and cheese are good options. Drink fluids throughout the day. Try ginger ale made with real ginger, ginger tea made from fresh grated ginger, or ginger candies. General instructions Do not use any products that contain nicotine or tobacco. These products include cigarettes, chewing tobacco, and vaping devices, such as e-cigarettes. If you need help quitting, ask your health care provider. Get an air purifier to keep the air in your house free of odors. Get plenty of fresh air. Try to avoid odors that trigger your nausea. Consider trying these methods to help relieve symptoms: Wearing an acupressure wristband. These wristbands are often worn for seasickness. Acupuncture. Contact a health care provider if: Your home remedies are not working and you need medicine. You feel dizzy or light-headed. You are losing weight. Get help right away if: You have persistent and uncontrolled nausea and vomiting. You faint. You have severe pain in your abdomen. Summary Morning sickness is when a woman feels nauseous during pregnancy. This nauseous feeling may or may not come with vomiting. Morning sickness is most common during the first trimester. It often occurs in the morning, but it can be a problem at any time of day. In many cases, treatment is not needed for this condition. Making some changes to what you eat may help to control symptoms. This  information is not intended to replace advice given to you by your health care provider. Make sure you discuss any questions you have with your health care provider. Document Revised: 05/01/2020 Document  Reviewed: 04/10/2020 Elsevier Patient Education  [place]. Exercise During Pregnancy Exercise is an important part of being healthy for people of all ages. Exercise improves the function of your heart and lungs and helps you maintain strength, flexibility, and a healthy body weight. Exercise also boosts energy levels and elevates mood. Most women should exercise regularly during pregnancy. Exercise routines may need to change as your pregnancy progresses. In rare cases, women with certain medical conditions or complications may be asked to limit or avoid exercise during pregnancy. Your health care provider will give you information on what will work for you. How does this affect me? Along with maintaining general strength and flexibility, exercising during pregnancy can help: Keep strength in muscles that are used during labor and childbirth. Decrease low back pain or symptoms of depression. Control weight gain during pregnancy. Reduce the risk of needing insulin if you develop diabetes during pregnancy. Decrease the risk of cesarean delivery. Speed up your recovery after giving birth. Relieve constipation. How does this affect my baby? Exercise can help you have a healthy pregnancy. Exercise does not cause early (premature) birth. It will not cause your baby to weigh less at birth. What exercises can I do? Many exercises are safe for you to do during pregnancy. Do a variety of exercises that safely increase your heart and breathing rates and help you build and maintain muscle strength. Do exercises exactly as told by your health care provider. You may do these exercises: Walking. Swimming. Water aerobics. Riding a stationary bike. Modified yoga or Pilates. Tell your instructor that you are pregnant. Avoid overstretching, and avoid lying on your back for long periods of time. Running or jogging. Choose this type of exercise only if: You ran or jogged regularly before your  pregnancy. You can run or jog and still talk in complete sentences. What exercises should I avoid? You may be told to limit high-intensity exercise depending on your level of fitness and whether you exercised regularly before you were pregnant. You can tell that you are exercising at a high intensity if you are breathing much harder and faster and cannot hold a conversation while exercising. You must avoid: Contact sports. Activities that put you at risk for falling on or being hit in the belly, such as downhill skiing, waterskiing, surfing, rock climbing, cycling, gymnastics, and horseback riding. Scuba diving. Skydiving. Hot yoga or hot Pilates. These activities take place in a room that is heated to high temperatures. Jogging or running, unless you jogged or ran regularly before you were pregnant. While jogging or running, you should always be able to talk in full sentences. Do not run or jog so fast that you are unable to have a conversation. Do not exercise at more than 6,000 feet above sea level (high elevation) if you are not used to exercising at high elevation. How do I exercise in a safe way?  Avoid overheating. Do not exercise in very high temperatures. Wear loose-fitting, breathable clothes. Avoid dehydration. Drink enough fluid before, during, and after exercise to keep your urine pale yellow. Avoid overstretching. Because of hormone changes during pregnancy, it is easy to overstretch muscles, tendons, and ligaments. Start slowly and ask your health care provider to recommend the types of exercise that are safe for you. Do not exercise  to lose weight. Wear a sports bra to support your breasts. Avoid standing still or lying flat on your back as much as you can. Follow these instructions at home: Exercise on most days or all days of the week. Try to exercise for 30 minutes a day, 5 days a week, unless your health care provider tells you not to. If you actively exercised before your  pregnancy and you are healthy, your health care provider may tell you to continue to do moderate-intensity to high-intensity exercise. If you are just starting to exercise or did not exercise much before your pregnancy, your health care provider may tell you to do low-intensity to moderate-intensity exercise. Questions to ask your health care provider Is exercise safe for me? What are signs that I should stop exercising? Does my health condition mean that I should not exercise during pregnancy? When should I avoid exercising during pregnancy? Stop exercising and contact a health care provider if: You have any unusual symptoms such as: Mild contractions of the uterus or cramps in the abdomen. A dizzy feeling that does not go away when you rest. Stop exercising and get help right away if: You have any unusual symptoms such as: Sudden, severe pain in your low back or your belly. Regular, painful contractions of your uterus. Chest pain. Bleeding or fluid leaking from your vagina. Shortness of breath. Headache. Pain and swelling of your calves. Summary Most women should exercise regularly throughout pregnancy. In rare cases, women with certain medical conditions or complications may be asked to limit or avoid exercise during pregnancy. Do not exercise to lose weight during pregnancy. Your health care provider will tell you what level of physical activity is right for you. Stop exercising and contact a health care provider if you have unusual symptoms, such as mild contractions or dizziness. This information is not intended to replace advice given to you by your health care provider. Make sure you discuss any questions you have with your health care provider. Document Revised: 05/03/2020 Document Reviewed: 05/03/2020 Elsevier Patient Education  [place] for Pregnant Women While you are pregnant, your body requires additional nutrition to help support your growing baby.  You also have a higher need for some vitamins and minerals, such as folic acid, calcium, iron, and vitamin D. Eating a healthy, well-balanced diet is very important for your health and your baby's health. Your need for extra calories varies over the course of your pregnancy. Pregnancy is divided into three trimesters, with each trimester lasting 3 months. For most women, it is recommended to consume: 150 extra calories a day during the first trimester. 300 extra calories a day during the second trimester. 300 extra calories a day during the third trimester. What are tips for following this plan? Cooking Practice good food safety and cleanliness. Wash your hands before you eat and after you prepare raw meat. Wash all fruits and vegetables well before peeling or eating. Taking these actions can help to prevent foodborne illnesses that can be very dangerous to your baby, such as listeriosis. Ask your health care provider for more information about listeriosis. Make sure that all meats, poultry, and eggs are cooked to food-safe temperatures or "well-done." Meal planning  Eat a variety of foods (especially fruits and vegetables) to get a full range of vitamins and minerals. Two or more servings of fish are recommended each week in order to get the most benefits from omega-3 fatty acids that are found in seafood. Choose fish  that are lower in mercury, such as salmon and pollock. Limit your overall intake of foods that have "empty calories." These are foods that have little nutritional value, such as sweets, desserts, candies, and sugar-sweetened beverages. Drinks that contain caffeine are okay to drink, but it is better to avoid caffeine. Keep your total caffeine intake to less than 200 mg each day (which is 12 oz or 355 mL of coffee, tea, or soda) or the limit as told by your health care provider. General information Do not try to lose weight or go on a diet during pregnancy. Take a prenatal vitamin to  help meet your additional vitamin and mineral needs during pregnancy, specifically for folic acid, iron, calcium, and vitamin D. Remember to stay active. Ask your health care provider what types of exercise and activities are safe for you. What does 150 extra calories look like? Healthy options that provide 150 extra calories each day could be any of the following: 6-8 oz (170-227 g) plain low-fat yogurt with  cup (70 g) berries. 1 apple with 2 tsp (11 g) peanut butter. Cut-up vegetables with  cup (60 g) hummus. 8 fl oz (237 mL) low-fat chocolate milk. 1 stick of string cheese with 1 medium orange. 1 peanut butter and jelly sandwich that is made with one slice of whole-wheat bread and 1 tsp (5 g) of peanut butter. For 300 extra calories, you could eat two of these healthy options each day. What is a healthy amount of weight to gain? The right amount of weight gain for you is based on your BMI (body mass index) before you became pregnant. If your BMI was less than 18 (underweight), you should gain 28-40 lb (13-18 kg). If your BMI was 18-24.9 (normal), you should gain 25-35 lb (11-16 kg). If your BMI was 25-29.9 (overweight), you should gain 15-25 lb (7-11 kg). If your BMI was 30 or greater (obese), you should gain 11-20 lb (5-9 kg). What if I am having twins or multiples? Generally, if you are carrying twins or multiples: You may need to eat 300-600 extra calories a day. The recommended range for total weight gain is 25-54 lb (11-25 kg), depending on your BMI before pregnancy. Talk with your health care provider to find out about nutritional needs, weight gain, and exercise that is right for you. What foods should I eat?  Fruits All fruits. Eat a variety of colors and types of fruit. Remember to wash your fruits well before peeling or eating. Vegetables All vegetables. Eat a variety of colors and types of vegetables. Remember to wash your vegetables well before peeling or  eating. Grains All grains. Choose whole grains, such as whole-wheat bread, oatmeal, or brown rice. Meats and other protein foods Lean meats, including chicken, [place], and lean cuts of beef, veal, or pork. Fish that is higher in omega-3 fatty acids and lower in mercury, such as salmon, herring, mussels, trout, sardines, pollock, shrimp, crab, and lobster. Tofu. Tempeh. Beans. Eggs. Peanut butter and other nut butters. Dairy Pasteurized milk and milk alternatives, such as almond milk. Pasteurized yogurt and pasteurized cheese. Cottage cheese. Sour cream. Beverages Water. Juices that contain 100% fruit juice or vegetable juice. Caffeine-free teas and decaffeinated coffee. Fats and oils Fats and oils are okay to include in moderation. Sweets and desserts Sweets and desserts are okay to include in moderation. Seasoning and other foods All pasteurized condiments. The items listed above may not be a complete list of foods and beverages you can eat.  Contact a dietitian for more information. What foods should I avoid? Fruits Raw (unpasteurized) fruit juices. Vegetables Unpasteurized vegetable juices. Meats and other protein foods Precooked or cured meat, such as bologna, hot dogs, sausages, or meat loaves. (If you must eat those meats, reheat them until they are steaming hot.) Refrigerated pate, meat spreads from a meat counter, or smoked seafood that is found in the refrigerated section of a store. Raw or undercooked meats, poultry, and eggs. Raw fish, such as sushi or sashimi. Fish that have high mercury content, such as tilefish, shark, swordfish, and king mackerel. Dairy Unpasteurized milk and any foods that have unpasteurized milk in them. Soft cheeses, such as feta, queso blanco, queso fresco, [place], [place], panela, and blue-veined cheeses (unless they are made with pasteurized milk, which must be stated on the label). Beverages Alcohol. Sugar-sweetened beverages, such as sodas, teas, or  energy drinks. Seasoning and other foods Homemade fermented foods and drinks, such as pickles, sauerkraut, or kombucha drinks. (Store-bought pasteurized versions of these are okay.) Salads that are made in a store or deli, such as ham salad, chicken salad, egg salad, tuna salad, and seafood salad. The items listed above may not be a complete list of foods and beverages you should avoid. Contact a dietitian for more information. Where to find more information To calculate the number of calories you need based on your height, weight, and activity level, you can use an online calculator such as: https://www.hunter.com/ To calculate how much weight you should gain during pregnancy, you can use an online pregnancy weight gain calculator such as: MassVoice.es To learn more about eating fish during pregnancy, talk with your health care provider or visit: [place] Summary While you are pregnant, your body requires additional nutrition to help support your growing baby. Eat a variety of foods, especially fruits and vegetables, to get a full range of vitamins and minerals. Practice good food safety and cleanliness. Wash your hands before you eat and after you prepare raw meat. Wash all fruits and vegetables well before peeling or eating. Taking these actions can help to prevent foodborne illnesses, such as listeriosis, that can be very dangerous to your baby. Do not eat raw meat or fish. Do not eat fish that have high mercury content, such as tilefish, shark, swordfish, and king mackerel. Do not eat raw (unpasteurized) dairy. Take a prenatal vitamin to help meet your additional vitamin and mineral needs during pregnancy, specifically for folic acid, iron, calcium, and vitamin D. This information is not intended to replace advice given to you by your health care provider. Make sure you discuss any questions you have with your health care provider. Document Revised: 04/13/2020 Document Reviewed:  04/13/2020 Elsevier Patient Education  [place]. Prenatal Care Prenatal care is health care during pregnancy. It helps you and your unborn baby (fetus) stay as healthy as possible. Prenatal care may be provided by a midwife, a family practice doctor, a IT consultant (nurse practitioner or physician assistant), or a childbirth and pregnancy doctor (obstetrician). How does this affect me? During pregnancy, you will be closely monitored for any new conditions that might develop. To lower your risk of pregnancy complications, you and your health care provider will talk about any underlying conditions you have. How does this affect my baby? Early and consistent prenatal care increases the chance that your baby will be healthy during pregnancy. Prenatal care lowers the risk that your baby will be: Born early (prematurely). Smaller than expected at birth (small for  gestational age). What can I expect at the first prenatal care visit? Your first prenatal care visit will likely be the longest. You should schedule your first prenatal care visit as soon as you know that you are pregnant. Your first visit is a good time to talk about any questions or concerns you have about pregnancy. Medical history At your visit, you and your health care provider will talk about your medical history, including: Any past pregnancies. Your family's medical history. Medical history of the baby's father. Any long-term (chronic) health conditions you have and how you manage them. Any surgeries or procedures you have had. Any current over-the-counter or prescription medicines, herbs, or supplements that you are taking. Other factors that could pose a risk to your baby, including: Exposure to harmful chemicals or radiation at work or at home. Any substance use, including tobacco, alcohol, and drug use. Your home setting and your stress levels, including: Exposure to abuse or violence. Household financial  strain. Your daily health habits, including diet and exercise. Tests and screenings Your health care provider will: Measure your weight, height, and blood pressure. Do a physical exam, including a pelvic and breast exam. Perform blood tests and urine tests to check for: Urinary tract infection. Sexually transmitted infections (STIs). Low iron levels in your blood (anemia). Blood type and certain proteins on red blood cells (Rh antibodies). Infections and immunity to viruses, such as hepatitis B and rubella. HIV (human immunodeficiency virus). Discuss your options for genetic screening. Tips about staying healthy Your health care provider will also give you information about how to keep yourself and your baby healthy, including: Nutrition and taking vitamins. Physical activity. How to manage pregnancy symptoms such as nausea and vomiting (morning sickness). Infections and substances that may be harmful to your baby and how to avoid them. Food safety. Dental care. Working. Travel. Warning signs to watch for and when to call your health care provider. How often will I have prenatal care visits? After your first prenatal care visit, you will have regular visits throughout your pregnancy. The visit schedule is often as follows: Up to week 28 of pregnancy: once every 4 weeks. 28-36 weeks: once every 2 weeks. After 36 weeks: every week until delivery. Some women may have visits more or less often depending on any underlying health conditions and the health of the baby. Keep all follow-up and prenatal care visits. This is important. What happens during routine prenatal care visits? Your health care provider will: Measure your weight and blood pressure. Check for fetal heart sounds. Measure the height of your uterus in your abdomen (fundal height). This may be measured starting around week 20 of pregnancy. Check the position of your baby inside your uterus. Ask questions about your diet,  sleeping patterns, and whether you can feel the baby move. Review warning signs to watch for and signs of labor. Ask about any pregnancy symptoms you are having and how you are dealing with them. Symptoms may include: Headaches. Nausea and vomiting. Vaginal discharge. Swelling. Fatigue. Constipation. Changes in your vision. Feeling persistently sad or anxious. Any discomfort, including back or pelvic pain. Bleeding or spotting. Make a list of questions to ask your health care provider at your routine visits. What tests might I have during prenatal care visits? You may have blood, urine, and imaging tests throughout your pregnancy, such as: Urine tests to check for glucose, protein, or signs of infection. Glucose tests to check for a form of diabetes that can develop during  pregnancy (gestational diabetes mellitus). This is usually done around week 24 of pregnancy. Ultrasounds to check your baby's growth and development, to check for birth defects, and to check your baby's well-being. These can also help to decide when you should deliver your baby. A test to check for group B strep (GBS) infection. This is usually done around week 36 of pregnancy. Genetic testing. This may include blood, fluid, or tissue sampling, or imaging tests, such as an ultrasound. Some genetic tests are done during the first trimester and some are done during the second trimester. What else can I expect during prenatal care visits? Your health care provider may recommend getting certain vaccines during pregnancy. These may include: A yearly flu shot (annual influenza vaccine). This is especially important if you will be pregnant during flu season. Tdap (tetanus, diphtheria, pertussis) vaccine. Getting this vaccine during pregnancy can protect your baby from whooping cough (pertussis) after birth. This vaccine may be recommended between weeks 27 and 36 of pregnancy. A COVID-19 vaccine. Later in your pregnancy, your  health care provider may give you information about: Childbirth and breastfeeding classes. Choosing a health care provider for your baby. Umbilical cord banking. Breastfeeding. Birth control after your baby is born. The hospital labor and delivery unit and how to set up a tour. Registering at the hospital before you go into labor. Where to find more information Office on Women's Health: LegalWarrants.gl American Pregnancy Association: americanpregnancy.org March of Dimes: marchofdimes.org Summary Prenatal care helps you and your baby stay as healthy as possible during pregnancy. Your first prenatal care visit will most likely be the longest. You will have visits and tests throughout your pregnancy to monitor your health and your baby's health. Bring a list of questions to your visits to ask your health care provider. Make sure to keep all follow-up and prenatal care visits. This information is not intended to replace advice given to you by your health care provider. Make sure you discuss any questions you have with your health care provider. Document Revised: 06/29/2020 Document Reviewed: 06/29/2020 Elsevier Patient Education  [place].

## 2022-04-25 ENCOUNTER — Encounter: Payer: Self-pay | Admitting: Advanced Practice Midwife

## 2022-04-25 DIAGNOSIS — D219 Benign neoplasm of connective and other soft tissue, unspecified: Secondary | ICD-10-CM | POA: Insufficient documentation

## 2022-04-25 LAB — CERVICOVAGINAL ANCILLARY ONLY
Chlamydia: NEGATIVE
Comment: NEGATIVE
Comment: NORMAL
Neisseria Gonorrhea: NEGATIVE
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
## 2022-04-26 LAB — HGB FRACTIONATION CASCADE
Hgb A2: 3 % (ref 1.8–3.2)
Hgb A: 64.3 % — ABNORMAL LOW (ref 96.4–98.8)
Hgb F: 0 % (ref 0.0–2.0)
Hgb S: 32.7 % — ABNORMAL HIGH

## 2022-04-26 LAB — URINE CULTURE

## 2022-04-26 LAB — HGB SOLUBILITY: Hgb Solubility: POSITIVE — AB

## 2022-04-29 ENCOUNTER — Other Ambulatory Visit: Payer: Self-pay | Admitting: Advanced Practice Midwife

## 2022-04-29 ENCOUNTER — Encounter: Payer: Self-pay | Admitting: Advanced Practice Midwife

## 2022-04-29 DIAGNOSIS — B951 Streptococcus, group B, as the cause of diseases classified elsewhere: Secondary | ICD-10-CM

## 2022-04-29 DIAGNOSIS — O2342 Unspecified infection of urinary tract in pregnancy, second trimester: Secondary | ICD-10-CM

## 2022-04-29 MED ORDER — AMPICILLIN 500 MG PO CAPS: 500.0000 mg | ORAL_CAPSULE | Freq: Four times a day (QID) | ORAL | 0 refills | Status: DC

## 2022-04-29 NOTE — Progress Notes (Signed)
Rx ampicillin sent to treat gbs uti. Message to patient.

## 2022-04-30 MED ORDER — AMPICILLIN 500 MG PO CAPS: 500.0000 mg | ORAL_CAPSULE | Freq: Four times a day (QID) | ORAL | 0 refills | Status: DC

## 2022-05-19 ENCOUNTER — Encounter: Payer: Self-pay | Admitting: Advanced Practice Midwife

## 2022-05-20 ENCOUNTER — Other Ambulatory Visit: Payer: Self-pay | Admitting: Obstetrics

## 2022-05-20 MED ORDER — BUTALBITAL-APAP-CAFFEINE 50-325-40 MG PO CAPS: 1.0000 | ORAL_CAPSULE | Freq: Four times a day (QID) | ORAL | 0 refills | Status: DC | PRN

## 2022-05-20 NOTE — Telephone Encounter (Signed)
Can you refill this for Opal Sidles?

## 2022-05-20 NOTE — Progress Notes (Signed)
Patien has contacted the office with c/o migraine headaches. [place] and did not indicate a pharmacy. I have sent her a note and also sent in a limited amount of Fioricet (10 tabs)to [facility] local [facility]. She is [redacted] weeks gestation. Imagene Riches, CNM  05/20/2022 9:34 PM

## 2022-05-22 ENCOUNTER — Encounter: Payer: No Typology Code available for payment source | Admitting: Obstetrics

## 2022-05-23 ENCOUNTER — Ambulatory Visit (INDEPENDENT_AMBULATORY_CARE_PROVIDER_SITE_OTHER): Payer: No Typology Code available for payment source | Admitting: Obstetrics

## 2022-05-23 VITALS — BP 126/74 | Wt 143.0 lb

## 2022-05-23 DIAGNOSIS — Z3480 Encounter for supervision of other normal pregnancy, unspecified trimester: Secondary | ICD-10-CM

## 2022-05-23 DIAGNOSIS — Z3490 Encounter for supervision of normal pregnancy, unspecified, unspecified trimester: Secondary | ICD-10-CM

## 2022-05-23 NOTE — Progress Notes (Signed)
Routine Prenatal Care Visit  Subjective  Cristina Allen is a 36 y.o. G4P0030 at 66w5dbeing seen today for ongoing prenatal care.  She is currently monitored for the following issues for this high-risk pregnancy and has Psoriasis; GAD (generalized anxiety disorder); Supervision of other normal pregnancy, antepartum; and Fibroid on their problem list.  ----------------------------------------------------------------------------------- Patient reports no complaints.  Her fundal height is elevated today. Contractions: Not present. Vag. Bleeding: None.   . Leaking Fluid denies.  ----------------------------------------------------------------------------------- The following portions of the patient's history were reviewed and updated as appropriate: allergies, current medications, past family history, past medical history, past social history, past surgical history and problem list. Problem list updated.  Objective  Blood pressure 126/74, weight 143 lb (64.9 kg). Pregravid weight 142 lb (64.4 kg) Total Weight Gain 1 lb (0.454 kg) Urinalysis: Urine Protein    Urine Glucose    Fetal Status:           General:  Alert, oriented and cooperative. Patient is in no acute distress.  Skin: Skin is warm and dry. No rash noted.   Cardiovascular: Normal heart rate noted  Respiratory: Normal respiratory effort, no problems with respiration noted  Abdomen: Soft, gravid, appropriate for gestational age. Pain/Pressure: Absent     Pelvic:  Cervical exam deferred        Extremities: Normal range of motion.     Mental Status: Normal mood and affect. Normal behavior. Normal judgment and thought content.   Assessment   36y.o. G4P0030 at 157w5dy  10/26/2022, by Ultrasound presenting for routine prenatal visit  Plan   fifth Problems (from 04/08/22 to present)    Problem Noted Resolved   Supervision of other normal pregnancy, antepartum 04/08/2022 by JoCleophas DunkerCMA No   Overview Signed 04/25/2022   3:55 PM by GlRod CanCNCowlingtontaff Provider  Office Location  [facility] Dating  EDD by 10w u/s  Language  English Anatomy [place]  Flu Vaccine   Genetic Screen  NIPS: neg/xx  TDaP vaccine    Hgb A1C or  GTT Early : NA Third trimester :   Covid    LAB RESULTS   Rhogam   Blood Type O/Positive/-- (07/18 1453)   Feeding Plan Breast Antibody Negative (07/18 1453)  Contraception  Rubella 10.50 (07/18 1453)  Circumcision  RPR Non Reactive (07/18 1453)   Pediatrician   HBsAg Negative (07/18 1453)   Support Person [name] HIV Non Reactive (07/18 1453)  Prenatal Classes  Varicella     GBS  (For PCN allergy, check sensitivities)   BTL Consent     VBAC Consent NA Pap  [facility]    Hgb Electro    Pelvis Tested  CF      SMA                   Preterm labor symptoms and general obstetric precautions including but not limited to vaginal bleeding, contractions, leaking of fluid and fetal movement were reviewed in detail with the patient. Please refer to After Visit Summary for other counseling recommendations.   Return in about 4 weeks (around 06/20/2022) for return OB.  MaImagene RichesCNM  05/23/2022 3:49 PM

## 2022-05-23 NOTE — Progress Notes (Signed)
No vb. No lof.  

## 2022-06-05 ENCOUNTER — Ambulatory Visit: Payer: No Typology Code available for payment source | Attending: Obstetrics

## 2022-06-05 ENCOUNTER — Other Ambulatory Visit: Payer: Self-pay | Admitting: *Deleted

## 2022-06-05 ENCOUNTER — Ambulatory Visit: Payer: No Typology Code available for payment source | Admitting: *Deleted

## 2022-06-05 VITALS — BP 124/82 | HR 100

## 2022-06-05 DIAGNOSIS — Z3A19 19 weeks gestation of pregnancy: Secondary | ICD-10-CM | POA: Insufficient documentation

## 2022-06-05 DIAGNOSIS — Z3480 Encounter for supervision of other normal pregnancy, unspecified trimester: Secondary | ICD-10-CM

## 2022-06-05 DIAGNOSIS — Z3490 Encounter for supervision of normal pregnancy, unspecified, unspecified trimester: Secondary | ICD-10-CM | POA: Diagnosis not present

## 2022-06-05 DIAGNOSIS — O09522 Supervision of elderly multigravida, second trimester: Secondary | ICD-10-CM | POA: Diagnosis present

## 2022-06-05 DIAGNOSIS — Z363 Encounter for antenatal screening for malformations: Secondary | ICD-10-CM | POA: Diagnosis not present

## 2022-06-05 DIAGNOSIS — D259 Leiomyoma of uterus, unspecified: Secondary | ICD-10-CM

## 2022-06-05 DIAGNOSIS — O3412 Maternal care for benign tumor of corpus uteri, second trimester: Secondary | ICD-10-CM | POA: Diagnosis not present

## 2022-06-05 NOTE — Progress Notes (Signed)
124 80 100 

## 2022-06-17 ENCOUNTER — Telehealth: Payer: Self-pay

## 2022-06-17 NOTE — Telephone Encounter (Signed)
Patient states she received notice of her FMLA needing additional information: Frequency/Duration of visits. She advised the company has sent request for this additional information. She would like follow up when this has been completed. [phone]

## 2022-06-17 NOTE — Telephone Encounter (Signed)
Received paperwork. Will complete this week when scheduled FMLA

## 2022-06-19 ENCOUNTER — Ambulatory Visit (INDEPENDENT_AMBULATORY_CARE_PROVIDER_SITE_OTHER): Payer: No Typology Code available for payment source | Admitting: Licensed Practical Nurse

## 2022-06-19 VITALS — BP 120/80 | Wt 143.0 lb

## 2022-06-19 DIAGNOSIS — Z3A21 21 weeks gestation of pregnancy: Secondary | ICD-10-CM

## 2022-06-19 DIAGNOSIS — O0990 Supervision of high risk pregnancy, unspecified, unspecified trimester: Secondary | ICD-10-CM

## 2022-06-19 NOTE — Progress Notes (Signed)
Routine Prenatal Care Visit  Subjective  Cristina Allen is a 36 y.o. G4P0030 at 48w4dbeing seen today for ongoing prenatal care.  She is currently monitored for the following issues for this high-risk pregnancy and has Psoriasis; GAD (generalized anxiety disorder); Supervision of other normal pregnancy, antepartum; and Fibroid on their problem list.  ----------------------------------------------------------------------------------- Patient reports  had some lower abd cramping pain after sitting for a long time, sounds like she may have aggravated her bladder. .  Has noticed a lump in her abd, it is soft and is able to push it back in, it is not painful but she is aware it is there. Small visible and palpable lump at the umbilicus, discussed this is most likely a hernia, for now we will monitor, consider consult with surgery after PP period.  -Desires to breastfeed -Does not desire future pregnancies, undecided about birth control  Contractions: Not present. Vag. Bleeding: None.  Movement: Absent. Leaking Fluid denies.  ----------------------------------------------------------------------------------- The following portions of the patient's history were reviewed and updated as appropriate: allergies, current medications, past family history, past medical history, past social history, past surgical history and problem list. Problem list updated.  Objective  Blood pressure 120/80, weight 143 lb (64.9 kg). Pregravid weight 142 lb (64.4 kg) Total Weight Gain 1 lb (0.454 kg) Urinalysis: Urine Protein    Urine Glucose    Fetal Status: Fetal Heart Rate (bpm): 140   Movement: Absent     General:  Alert, oriented and cooperative. Patient is in no acute distress.  Skin: Skin is warm and dry. No rash noted.   Cardiovascular: Normal heart rate noted  Respiratory: Normal respiratory effort, no problems with respiration noted  Abdomen: Soft, gravid, appropriate for gestational age. Pain/Pressure:  Present     Pelvic:  Cervical exam deferred        Extremities: Normal range of motion.     Mental Status: Normal mood and affect. Normal behavior. Normal judgment and thought content.   Assessment   36y.o. G4P0030 at 2105w4dy  10/26/2022, by Ultrasound presenting for routine prenatal visit  Plan   fifth Problems (from 04/08/22 to present)     Problem Noted Resolved   Supervision of other normal pregnancy, antepartum 04/08/2022 by JoCleophas DunkerCMCarson Cityo   Overview Addendum 06/05/2022 10:14 PM by FrImagene RichesCNM     Nursing Staff Provider  Office Location  [facility] Dating  EDD by 10w u/s  Language  English Anatomy [place]- will have monthly scans  Flu Vaccine   Genetic Screen  NIPS: neg/xx  TDaP vaccine    Hgb A1C or  GTT Early : NA Third trimester :   Covid    LAB RESULTS   Rhogam   Blood Type O/Positive/-- (07/18 1453)   Feeding Plan Breast Antibody Negative (07/18 1453)  Contraception  Rubella 10.50 (07/18 1453)  Circumcision  RPR Non Reactive (07/18 1453)   Pediatrician   HBsAg Negative (07/18 1453)   Support Person [name] HIV Non Reactive (07/18 1453)  Prenatal Classes  Varicella     GBS  (For PCN allergy, check sensitivities)   BTL Consent     VBAC Consent NA [facility]  [facility]    Hgb Electro    Pelvis Tested  CF      SMA                    Preterm labor symptoms and general obstetric precautions including but  not limited to vaginal bleeding, contractions, leaking of fluid and fetal movement were reviewed in detail with the patient. Please refer to After Visit Summary for other counseling recommendations.   Return in about 4 weeks (around 07/17/2022) for ROB.  Roberto Scales, CNM  Mosetta Pigeon, [facility]  06/19/22  4:42 PM

## 2022-06-20 NOTE — Telephone Encounter (Signed)
Fixed and faxed

## 2022-06-28 ENCOUNTER — Encounter: Payer: Self-pay | Admitting: Obstetrics

## 2022-07-03 ENCOUNTER — Ambulatory Visit: Payer: No Typology Code available for payment source | Admitting: *Deleted

## 2022-07-03 ENCOUNTER — Ambulatory Visit: Payer: No Typology Code available for payment source | Attending: Maternal & Fetal Medicine

## 2022-07-03 VITALS — BP 116/74 | HR 94

## 2022-07-03 DIAGNOSIS — O09522 Supervision of elderly multigravida, second trimester: Secondary | ICD-10-CM | POA: Insufficient documentation

## 2022-07-03 DIAGNOSIS — D259 Leiomyoma of uterus, unspecified: Secondary | ICD-10-CM | POA: Diagnosis not present

## 2022-07-03 DIAGNOSIS — O3412 Maternal care for benign tumor of corpus uteri, second trimester: Secondary | ICD-10-CM | POA: Insufficient documentation

## 2022-07-03 DIAGNOSIS — Z3A23 23 weeks gestation of pregnancy: Secondary | ICD-10-CM

## 2022-07-05 ENCOUNTER — Telehealth: Payer: Self-pay

## 2022-07-05 NOTE — Telephone Encounter (Signed)
Pt calling with questions about her FMLA please call back and advise, something about the dates that she is given off.

## 2022-07-12 ENCOUNTER — Other Ambulatory Visit: Payer: Self-pay | Admitting: Obstetrics

## 2022-07-17 ENCOUNTER — Encounter: Payer: Self-pay | Admitting: Licensed Practical Nurse

## 2022-07-17 ENCOUNTER — Ambulatory Visit (INDEPENDENT_AMBULATORY_CARE_PROVIDER_SITE_OTHER): Payer: No Typology Code available for payment source | Admitting: Licensed Practical Nurse

## 2022-07-17 VITALS — BP 120/82 | HR 93 | Wt 146.0 lb

## 2022-07-17 DIAGNOSIS — O99342 Other mental disorders complicating pregnancy, second trimester: Secondary | ICD-10-CM

## 2022-07-17 DIAGNOSIS — Z3402 Encounter for supervision of normal first pregnancy, second trimester: Secondary | ICD-10-CM

## 2022-07-17 DIAGNOSIS — Z131 Encounter for screening for diabetes mellitus: Secondary | ICD-10-CM

## 2022-07-17 DIAGNOSIS — G43001 Migraine without aura, not intractable, with status migrainosus: Secondary | ICD-10-CM

## 2022-07-17 DIAGNOSIS — O3412 Maternal care for benign tumor of corpus uteri, second trimester: Secondary | ICD-10-CM

## 2022-07-17 DIAGNOSIS — Z3A25 25 weeks gestation of pregnancy: Secondary | ICD-10-CM

## 2022-07-17 DIAGNOSIS — F411 Generalized anxiety disorder: Secondary | ICD-10-CM

## 2022-07-17 DIAGNOSIS — D259 Leiomyoma of uterus, unspecified: Secondary | ICD-10-CM

## 2022-07-17 MED ORDER — BUTALBITAL-APAP-CAFFEINE 50-325-40 MG PO CAPS: 1.0000 | ORAL_CAPSULE | Freq: Four times a day (QID) | ORAL | 0 refills | Status: DC | PRN

## 2022-07-17 NOTE — Patient Instructions (Signed)
To prevent headache Magnesium Oxide '400mg'$ , daily Robflavin '400mg'$  daily Co Enzyme Q 10 '150mg'$  daily   Considering Waterbirth? Guide for patients at [facility] [employer] [facility]) Why consider waterbirth? Gentle birth for babies  Less pain medicine used in labor  May allow for passive descent/less pushing  May reduce perineal tears  More mobility and instinctive maternal position changes  Increased maternal relaxation   Is waterbirth safe? What are the risks of infection, drowning or other complications? Infection:  Very low risk (3.7 % for tub vs 4.8% for bed)  7 in 8000 waterbirths with documented infection  Poorly cleaned equipment most common cause  Slightly lower group B strep transmission rate  Drowning  Maternal:  Very low risk  Related to seizures or fainting  Newborn:  Very low risk. No evidence of increased risk of respiratory problems in multiple large studies  Physiological protection from breathing under water  Avoid underwater birth if there are any fetal complications  Once baby's head is out of the water, keep it out.  Birth complication  Some reports of cord trauma, but risk decreased by bringing baby to surface gradually  No evidence of increased risk of shoulder dystocia. Mothers can usually change positions faster in water than in a bed, possibly aiding the maneuvers to free the shoulder.   There are 2 things you MUST do to have a waterbirth with [facility]: Attend a waterbirth class at [place] at [facility]   3rd Wednesday of every month from 7-9 pm (virtual during [place]) [employer] at [email or web address] or [email or web address] or by calling [phone] Bring [place] the certificate from the class to your prenatal appointment or send via [place] with a midwife at 36 weeks* to see if you can still plan a waterbirth and to sign the consent.   *We also recommend that you schedule as many of your prenatal visits with a  midwife as possible.    Helpful information: You may want to bring a bathing suit top to the hospital to wear during labor but this is optional.  All other supplies are provided by the hospital. Please arrive at the hospital with signs of active labor, and do not wait at home until late in labor. It takes 45 min- 1 hour for fetal monitoring, and check in to your room to take place, plus transport and filling of the waterbirth tub.    Things that would prevent you from having a waterbirth: Premature, <37wks  Previous cesarean birth  Presence of thick meconium-stained fluid  Multiple gestation (Twins, triplets, etc.)  Uncontrolled diabetes or gestational diabetes requiring medication  Hypertension diagnosed in pregnancy or preexisting hypertension (gestational hypertension, preeclampsia, or chronic hypertension) Fetal growth restriction (your baby measures less than 10th percentile on ultrasound) Heavy vaginal bleeding  Non-reassuring fetal heart rate  Active infection (MRSA, etc.). Group B Strep is NOT a contraindication for waterbirth.  If your labor has to be induced and induction method requires continuous monitoring of the baby's heart rate  Other risks/issues identified by your obstetrical provider   Please remember that birth is unpredictable. Under certain unforeseeable circumstances your provider may advise against giving birth in the tub. These decisions will be made on a case-by-case basis and with the safety of you and your baby as our highest priority.    Updated 01/02/22

## 2022-07-17 NOTE — Progress Notes (Signed)
Routine Prenatal Care Visit  Subjective  Cristina Allen is a 36 y.o. G4P0030 at 43w4dbeing seen today for ongoing prenatal care.  She is currently monitored for the following issues for this low-risk pregnancy and has Psoriasis; GAD (generalized anxiety disorder); Supervision of other normal pregnancy, antepartum; and Fibroid on their problem list.  ----------------------------------------------------------------------------------- Patient reports  headaches occur twice a week, they are on her right temple, haas hx of Migraine, can tolerate HA but when it is migraine she needs Fioricet-desires refill. Has been taking '150mg'$  magnesium daily. -has lower abd pain despite wearing support belt, it is aggravated by long periods of sitting or standing-rec frequent position change -desires waterbirth, has considered transferring to another practice for this, encouraged pt to meet with AWebb Silversmith Waterbirth info given -reviewed GDM screening for next visit   Contractions: Not present. Vag. Bleeding: None.  Movement: Present. Leaking Fluid denies.  ----------------------------------------------------------------------------------- The following portions of the patient's history were reviewed and updated as appropriate: allergies, current medications, past family history, past medical history, past social history, past surgical history and problem list. Problem list updated.  Objective  Blood pressure 120/82, pulse 93, weight 146 lb (66.2 kg). Pregravid weight 142 lb (64.4 kg) Total Weight Gain 4 lb (1.814 kg) Urinalysis: Urine Protein    Urine Glucose    Fetal Status: Fetal Heart Rate (bpm): 140 Fundal Height: 26 cm Movement: Present     General:  Alert, oriented and cooperative. Patient is in no acute distress.  Skin: Skin is warm and dry. No rash noted.   Cardiovascular: Normal heart rate noted  Respiratory: Normal respiratory effort, no problems with respiration noted  Abdomen: Soft, gravid,  appropriate for gestational age. Pain/Pressure: Present     Pelvic:  Cervical exam deferred        Extremities: Normal range of motion.     Mental Status: Normal mood and affect. Normal behavior. Normal judgment and thought content.   Assessment   36y.o. G4P0030 at 270w4dy  10/26/2022, by Ultrasound presenting for routine prenatal visit  Plan   fifth Problems (from 04/08/22 to present)     Problem Noted Resolved   Supervision of other normal pregnancy, antepartum 04/08/2022 by JoCleophas DunkerCMRio Lajaso   Overview Addendum 06/05/2022 10:14 PM by FrImagene RichesCNM     Nursing Staff Provider  Office Location  [facility] Dating  EDD by 10w u/s  Language  English Anatomy [place]- will have monthly scans  Flu Vaccine   Genetic Screen  NIPS: neg/xx  TDaP vaccine    Hgb A1C or  GTT Early : NA Third trimester :   Covid    LAB RESULTS   Rhogam   Blood Type O/Positive/-- (07/18 1453)   Feeding Plan Breast Antibody Negative (07/18 1453)  Contraception  Rubella 10.50 (07/18 1453)  Circumcision  RPR Non Reactive (07/18 1453)   Pediatrician   HBsAg Negative (07/18 1453)   Support Person [name] HIV Non Reactive (07/18 1453)  Prenatal Classes  Varicella     GBS  (For PCN allergy, check sensitivities)   BTL Consent     VBAC Consent NA Pap  [facility]    Hgb Electro    Pelvis Tested  CF      SMA                    Preterm labor symptoms and general obstetric precautions including but not limited to vaginal bleeding, contractions, leaking  of fluid and fetal movement were reviewed in detail with the patient. Please refer to After Visit Summary for other counseling recommendations.   Return in about 3 weeks (around 08/07/2022) for ROB, 28 wk labs, with Webb Silversmith to discuss waterbirth .  Encouraged increasing magnesium to '400mg'$ , plus Riboflavin and C Q -10 daily Fioricet renewed Next growth [place] 11/1   Roberto Scales, [facility]  07/17/22  3:48 PM

## 2022-07-31 ENCOUNTER — Ambulatory Visit: Payer: No Typology Code available for payment source | Admitting: *Deleted

## 2022-07-31 ENCOUNTER — Ambulatory Visit: Payer: No Typology Code available for payment source | Attending: Maternal & Fetal Medicine

## 2022-07-31 VITALS — BP 124/80 | HR 101

## 2022-07-31 DIAGNOSIS — O3412 Maternal care for benign tumor of corpus uteri, second trimester: Secondary | ICD-10-CM | POA: Diagnosis not present

## 2022-07-31 DIAGNOSIS — Z3A27 27 weeks gestation of pregnancy: Secondary | ICD-10-CM | POA: Diagnosis not present

## 2022-07-31 DIAGNOSIS — D259 Leiomyoma of uterus, unspecified: Secondary | ICD-10-CM | POA: Diagnosis not present

## 2022-07-31 DIAGNOSIS — Z3480 Encounter for supervision of other normal pregnancy, unspecified trimester: Secondary | ICD-10-CM | POA: Insufficient documentation

## 2022-07-31 DIAGNOSIS — O09522 Supervision of elderly multigravida, second trimester: Secondary | ICD-10-CM | POA: Diagnosis not present

## 2022-07-31 DIAGNOSIS — Z362 Encounter for other antenatal screening follow-up: Secondary | ICD-10-CM | POA: Insufficient documentation

## 2022-08-01 ENCOUNTER — Other Ambulatory Visit: Payer: Self-pay | Admitting: *Deleted

## 2022-08-01 DIAGNOSIS — D259 Leiomyoma of uterus, unspecified: Secondary | ICD-10-CM

## 2022-08-06 ENCOUNTER — Other Ambulatory Visit: Payer: No Typology Code available for payment source

## 2022-08-06 ENCOUNTER — Ambulatory Visit (INDEPENDENT_AMBULATORY_CARE_PROVIDER_SITE_OTHER): Payer: No Typology Code available for payment source | Admitting: Certified Nurse Midwife

## 2022-08-06 VITALS — BP 110/74 | HR 98 | Wt 149.3 lb

## 2022-08-06 DIAGNOSIS — Z131 Encounter for screening for diabetes mellitus: Secondary | ICD-10-CM

## 2022-08-06 DIAGNOSIS — Z3483 Encounter for supervision of other normal pregnancy, third trimester: Secondary | ICD-10-CM

## 2022-08-06 DIAGNOSIS — Z23 Encounter for immunization: Secondary | ICD-10-CM

## 2022-08-06 DIAGNOSIS — Z3402 Encounter for supervision of normal first pregnancy, second trimester: Secondary | ICD-10-CM

## 2022-08-06 DIAGNOSIS — Z3A28 28 weeks gestation of pregnancy: Secondary | ICD-10-CM

## 2022-08-06 NOTE — Progress Notes (Signed)
ROB doing well. Feels good movement. 28 wk labs today: Glucose screen/RPR/CBC. Tdap done, Blood transfusion consent completed, all questions answered. Ready set baby reviewed, see check list for topics covered. Sample birth plan given, will follow up in upcoming visits. Discussed birth control after delivery, information pamphlet given.  Discussed water birth . She has class on 16th.   Follow up 2 wk  ROB or sooner if needed.    Philip Aspen, CNM

## 2022-08-06 NOTE — Patient Instructions (Signed)
Oral Glucose Tolerance Test During Pregnancy Why am I having this test? The oral glucose tolerance test (OGTT) is done to check how your body processes blood sugar (glucose). This is one of several tests used to diagnose diabetes that develops during pregnancy (gestational diabetes mellitus). Gestational diabetes is a short-term form of diabetes that some women develop while they are pregnant. It usually occurs during the second trimester of pregnancy and goes away after delivery. Testing, or screening, for gestational diabetes usually occurs at weeks 24-28 of pregnancy. You may have the OGTT test after having a 1-hour glucose screening test if the results from that test indicate that you may have gestational diabetes. This test may also be needed if: You have a history of gestational diabetes. There is a history of giving birth to very large babies or of losing pregnancies (having stillbirths). You have signs and symptoms of diabetes, such as: Changes in your eyesight. Tingling or numbness in your hands or feet. Changes in hunger, thirst, and urination, and these are not explained by your pregnancy. What is being tested? This test measures the amount of glucose in your blood at different times during a period of 3 hours. This shows how well your body can process glucose. What kind of sample is taken?  Blood samples are required for this test. They are usually collected by inserting a needle into a blood vessel. How do I prepare for this test? For 3 days before your test, eat normally. Have plenty of carbohydrate-rich foods. Follow instructions from your health care provider about: Eating or drinking restrictions on the day of the test. You may be asked not to eat or drink anything other than water (to fast) starting 8-10 hours before the test. Changing or stopping your regular medicines. Some medicines may interfere with this test. Tell a health care provider about: All medicines you are  taking, including vitamins, herbs, eye drops, creams, and over-the-counter medicines. Any blood disorders you have. Any surgeries you have had. Any medical conditions you have. What happens during the test? First, your blood glucose will be measured. This is referred to as your fasting blood glucose because you fasted before the test. Then, you will drink a glucose solution that contains a certain amount of glucose. Your blood glucose will be measured again 1, 2, and 3 hours after you drink the solution. This test takes about 3 hours to complete. You will need to stay at the testing location during this time. During the testing period: Do not eat or drink anything other than the glucose solution. Do not exercise. Do not use any products that contain nicotine or tobacco, such as cigarettes, e-cigarettes, and chewing tobacco. These can affect your test results. If you need help quitting, ask your health care provider. The testing procedure may vary among health care providers and hospitals. How are the results reported? Your results will be reported as milligrams of glucose per deciliter of blood (mg/dL) or millimoles per liter (mmol/L). There is more than one source for screening and diagnosis reference values used to diagnose gestational diabetes. Your health care provider will compare your results to normal values that were established after testing a large group of people (reference values). Reference values may vary among labs and hospitals. For this test (Carpenter-Coustan), reference values are: Fasting: 95 mg/dL (5.3 mmol/L). 1 hour: 180 mg/dL (10.0 mmol/L). 2 hour: 155 mg/dL (8.6 mmol/L). 3 hour: 140 mg/dL (7.8 mmol/L). What do the results mean? Results below the reference values are   considered normal. If two or more of your blood glucose levels are at or above the reference values, you may be diagnosed with gestational diabetes. If only one level is high, your health care provider may  suggest repeat testing or other tests to confirm a diagnosis. Talk with your health care provider about what your results mean. Questions to ask your health care provider Ask your health care provider, or the department that is doing the test: When will my results be ready? How will I get my results? What are my treatment options? What other tests do I need? What are my next steps? Summary The oral glucose tolerance test (OGTT) is one of several tests used to diagnose diabetes that develops during pregnancy (gestational diabetes mellitus). Gestational diabetes is a short-term form of diabetes that some women develop while they are pregnant. You may have the OGTT test after having a 1-hour glucose screening test if the results from that test show that you may have gestational diabetes. You may also have this test if you have any symptoms or risk factors for this type of diabetes. Talk with your health care provider about what your results mean. This information is not intended to replace advice given to you by your health care provider. Make sure you discuss any questions you have with your health care provider. Document Revised: 04/23/2022 Document Reviewed: 02/24/2020 Elsevier Patient Education  2023 Elsevier Inc.  

## 2022-08-07 ENCOUNTER — Encounter: Payer: Self-pay | Admitting: Licensed Practical Nurse

## 2022-08-07 ENCOUNTER — Other Ambulatory Visit: Payer: No Typology Code available for payment source

## 2022-08-07 ENCOUNTER — Encounter: Payer: Self-pay | Admitting: Certified Nurse Midwife

## 2022-08-07 LAB — 28 WEEK RH+PANEL
Basophils Absolute: 0 10*3/uL (ref 0.0–0.2)
Basos: 0 %
EOS (ABSOLUTE): 0.1 10*3/uL (ref 0.0–0.4)
Eos: 1 %
Gestational Diabetes Screen: 131 mg/dL (ref 70–139)
HIV Screen 4th Generation wRfx: NONREACTIVE
Hematocrit: 26.5 % — ABNORMAL LOW (ref 34.0–46.6)
Hemoglobin: 8.1 g/dL — ABNORMAL LOW (ref 11.1–15.9)
Immature Grans (Abs): 0.1 10*3/uL (ref 0.0–0.1)
Immature Granulocytes: 1 %
Lymphocytes Absolute: 1.3 10*3/uL (ref 0.7–3.1)
Lymphs: 14 %
MCH: 21 pg — ABNORMAL LOW (ref 26.6–33.0)
MCHC: 30.6 g/dL — ABNORMAL LOW (ref 31.5–35.7)
MCV: 69 fL — ABNORMAL LOW (ref 79–97)
Monocytes Absolute: 0.4 10*3/uL (ref 0.1–0.9)
Monocytes: 4 %
Neutrophils Absolute: 7.7 10*3/uL — ABNORMAL HIGH (ref 1.4–7.0)
Neutrophils: 80 %
Platelets: 237 10*3/uL (ref 150–450)
RBC: 3.86 x10E6/uL (ref 3.77–5.28)
RDW: 18.9 % — ABNORMAL HIGH (ref 11.7–15.4)
RPR Ser Ql: NONREACTIVE
WBC: 9.6 10*3/uL (ref 3.4–10.8)

## 2022-08-19 ENCOUNTER — Ambulatory Visit (INDEPENDENT_AMBULATORY_CARE_PROVIDER_SITE_OTHER): Payer: No Typology Code available for payment source | Admitting: Certified Nurse Midwife

## 2022-08-19 ENCOUNTER — Encounter: Payer: Self-pay | Admitting: Certified Nurse Midwife

## 2022-08-19 VITALS — BP 134/84 | HR 116 | Wt 147.2 lb

## 2022-08-19 DIAGNOSIS — Z3483 Encounter for supervision of other normal pregnancy, third trimester: Secondary | ICD-10-CM

## 2022-08-19 DIAGNOSIS — Z3A30 30 weeks gestation of pregnancy: Secondary | ICD-10-CM

## 2022-08-19 NOTE — Progress Notes (Signed)
ROB doing well, feeling good movement. Reviewed PTL precautions . She denies any at this time. Discussed water birth she completed the course. She will bring in certificate if she plans to move forward with water births. She is considering tranfering to [facility] for water birth.   She is undecided about birth control and plans ot breastfeed.   Philip Aspen , CNM

## 2022-08-19 NOTE — Patient Instructions (Signed)
Preterm Labor Pregnancy normally lasts 39-41 weeks. Preterm labor is when labor starts before you have been pregnant for 37 weeks. Babies who are born too early may have a higher risk for long-term problems like cerebral palsy or developmental delays. They may also have problems soon after birth, such as problems with blood sugar, body temperature, heart, and breathing. These problems may be very serious in babies who are born before 34 weeks of pregnancy. What are the causes? The cause of this condition is not known. What increases the risk? You are more likely to have preterm labor if: You have medical problems, now or in the past. You have problems now or in your past pregnancies. You have lifestyle problems. Medical history You have problems of the womb (uterus). You have an infection, including infections you get from sex. You have problems that do not go away, such as: Blood clots. High blood pressure. High blood sugar. You have low body weight or too much body weight. Present and past pregnancies You have had preterm labor before. You are pregnant with two babies or more. You have a condition in which the placenta covers your cervix. You waited less than 18 months between giving birth and becoming pregnant again. Your unborn baby has some problems. You have bleeding from your vagina. You became pregnant by a method called IVF. Lifestyle You smoke. You drink alcohol. You use drugs. You have stress. You have abuse in your home. You come in contact with chemicals that harm the body (pollutants). Other factors You are younger than 17 years or older than 35 years. What are the signs or symptoms? Symptoms of this condition include: Cramps. The cramps may feel like cramps from a period. You may also have watery poop (diarrhea). Pain in the belly (abdomen). Pain in the lower back. Regular contractions. It may feel like your belly is getting tighter. Pressure in the lower  belly. More fluid leaking from the vagina. The fluid may be watery or bloody. Water breaking. How is this treated? Treatment for this condition depends on your health, the health of your baby, and how old your pregnancy is. It may include: Taking medicines, such as: Hormone medicines. Medicines to stop contractions. Medicines to help mature the baby's lungs. Medicines to prevent your baby from getting cerebral palsy or other problems. Bed rest. If the labor happens before 34 weeks of pregnancy, you may need to stay in the hospital. Delivering the baby. Follow these instructions at home:  Do not smoke or use any products that contain nicotine or tobacco. If you need help quitting, ask your doctor. Do not drink alcohol. Take over-the-counter and prescription medicines only as told by your doctor. Rest as told by your doctor. Return to your normal activities when your doctor says that it is safe. Keep all follow-up visits. How is this prevented? To have a healthy pregnancy: Do not use drugs. Do not use any medicines unless you ask your doctor if they are safe for you. Talk with your doctor before taking any herbal supplements. Make sure you gain enough weight. Watch for infection. If you think you might have an infection, get it checked right away. Symptoms of infection may include: Fever. Vaginal discharge that smells bad or is not normal. Pain or burning when you pee. Needing to pee urgently. Needing to pee often. Peeing small amounts often. Blood in your pee. Pee that smells bad or unusual. Where to find more information U.S. Department of Health and Human Services   Office on Women's Health: www.womenshealth.gov The American College of Obstetricians and Gynecologists: www.acog.org Centers for Disease Control and Prevention: www.cdc.gov Contact a doctor if: You think you are going into preterm labor. You have symptoms of preterm labor. You have symptoms of infection. Get help  right away if: You are having painful contractions every 5 minutes or less. Your water breaks. Summary Preterm labor is labor that starts before you reach 37 weeks of pregnancy. Your baby may have problems if delivered early. You are more likely to have preterm labor if you have certain medical problems or problems with a pregnancy now or in the past. Some lifestyle factors can also increase the risk. Contact a doctor if you have symptoms of preterm labor. This information is not intended to replace advice given to you by your health care provider. Make sure you discuss any questions you have with your health care provider. Document Revised: 09/19/2020 Document Reviewed: 09/19/2020 Elsevier Patient Education  2023 Elsevier Inc.  

## 2022-09-02 ENCOUNTER — Encounter: Payer: Self-pay | Admitting: Licensed Practical Nurse

## 2022-09-02 ENCOUNTER — Ambulatory Visit (INDEPENDENT_AMBULATORY_CARE_PROVIDER_SITE_OTHER): Payer: No Typology Code available for payment source | Admitting: Licensed Practical Nurse

## 2022-09-02 VITALS — BP 128/89 | HR 113 | Wt 151.0 lb

## 2022-09-02 DIAGNOSIS — Z3A32 32 weeks gestation of pregnancy: Secondary | ICD-10-CM

## 2022-09-02 DIAGNOSIS — Z3403 Encounter for supervision of normal first pregnancy, third trimester: Secondary | ICD-10-CM

## 2022-09-02 DIAGNOSIS — M5440 Lumbago with sciatica, unspecified side: Secondary | ICD-10-CM

## 2022-09-02 LAB — POCT URINALYSIS DIPSTICK OB
Bilirubin, UA: NEGATIVE
Blood, UA: NEGATIVE
Glucose, UA: NEGATIVE
Ketones, UA: NEGATIVE
Leukocytes, UA: NEGATIVE
Nitrite, UA: NEGATIVE
POC,PROTEIN,UA: NEGATIVE
Spec Grav, UA: 1.02 (ref 1.010–1.025)
Urobilinogen, UA: 0.2 E.U./dL
pH, UA: 6.5 (ref 5.0–8.0)

## 2022-09-02 MED ORDER — CYCLOBENZAPRINE HCL 5 MG PO TABS: 5.0000 mg | ORAL_TABLET | Freq: Three times a day (TID) | ORAL | 0 refills | Status: DC | PRN
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
## 2022-09-09 ENCOUNTER — Ambulatory Visit: Payer: No Typology Code available for payment source

## 2022-09-09 ENCOUNTER — Ambulatory Visit: Payer: No Typology Code available for payment source | Attending: Obstetrics

## 2022-09-09 VITALS — BP 131/86 | HR 107

## 2022-09-09 DIAGNOSIS — Z3480 Encounter for supervision of other normal pregnancy, unspecified trimester: Secondary | ICD-10-CM

## 2022-09-09 DIAGNOSIS — Z362 Encounter for other antenatal screening follow-up: Secondary | ICD-10-CM | POA: Insufficient documentation

## 2022-09-09 DIAGNOSIS — D259 Leiomyoma of uterus, unspecified: Secondary | ICD-10-CM

## 2022-09-09 DIAGNOSIS — O3413 Maternal care for benign tumor of corpus uteri, third trimester: Secondary | ICD-10-CM | POA: Diagnosis not present

## 2022-09-09 DIAGNOSIS — O09523 Supervision of elderly multigravida, third trimester: Secondary | ICD-10-CM

## 2022-09-09 DIAGNOSIS — Z3A33 33 weeks gestation of pregnancy: Secondary | ICD-10-CM | POA: Diagnosis not present

## 2022-09-09 DIAGNOSIS — O3412 Maternal care for benign tumor of corpus uteri, second trimester: Secondary | ICD-10-CM | POA: Insufficient documentation

## 2022-09-17 ENCOUNTER — Telehealth: Payer: Self-pay | Admitting: Certified Nurse Midwife

## 2022-09-17 ENCOUNTER — Ambulatory Visit (INDEPENDENT_AMBULATORY_CARE_PROVIDER_SITE_OTHER): Payer: No Typology Code available for payment source | Admitting: Certified Nurse Midwife

## 2022-09-17 ENCOUNTER — Encounter: Payer: Self-pay | Admitting: Certified Nurse Midwife

## 2022-09-17 VITALS — BP 128/84 | HR 109 | Wt 152.9 lb

## 2022-09-17 DIAGNOSIS — Z3A34 34 weeks gestation of pregnancy: Secondary | ICD-10-CM

## 2022-09-17 DIAGNOSIS — D509 Iron deficiency anemia, unspecified: Secondary | ICD-10-CM

## 2022-09-17 NOTE — Patient Instructions (Signed)
Preterm Labor Pregnancy normally lasts 39-41 weeks. Preterm labor is when labor starts before you have been pregnant for 37 weeks. Babies who are born too early may have a higher risk for long-term problems like cerebral palsy or developmental delays. They may also have problems soon after birth, such as problems with blood sugar, body temperature, heart, and breathing. These problems may be very serious in babies who are born before 34 weeks of pregnancy. What are the causes? The cause of this condition is not known. What increases the risk? You are more likely to have preterm labor if: You have medical problems, now or in the past. You have problems now or in your past pregnancies. You have lifestyle problems. Medical history You have problems of the womb (uterus). You have an infection, including infections you get from sex. You have problems that do not go away, such as: Blood clots. High blood pressure. High blood sugar. You have low body weight or too much body weight. Present and past pregnancies You have had preterm labor before. You are pregnant with two babies or more. You have a condition in which the placenta covers your cervix. You waited less than 18 months between giving birth and becoming pregnant again. Your unborn baby has some problems. You have bleeding from your vagina. You became pregnant by a method called IVF. Lifestyle You smoke. You drink alcohol. You use drugs. You have stress. You have abuse in your home. You come in contact with chemicals that harm the body (pollutants). Other factors You are younger than 17 years or older than 35 years. What are the signs or symptoms? Symptoms of this condition include: Cramps. The cramps may feel like cramps from a period. You may also have watery poop (diarrhea). Pain in the belly (abdomen). Pain in the lower back. Regular contractions. It may feel like your belly is getting tighter. Pressure in the lower  belly. More fluid leaking from the vagina. The fluid may be watery or bloody. Water breaking. How is this treated? Treatment for this condition depends on your health, the health of your baby, and how old your pregnancy is. It may include: Taking medicines, such as: Hormone medicines. Medicines to stop contractions. Medicines to help mature the baby's lungs. Medicines to prevent your baby from getting cerebral palsy or other problems. Bed rest. If the labor happens before 34 weeks of pregnancy, you may need to stay in the hospital. Delivering the baby. Follow these instructions at home:  Do not smoke or use any products that contain nicotine or tobacco. If you need help quitting, ask your doctor. Do not drink alcohol. Take over-the-counter and prescription medicines only as told by your doctor. Rest as told by your doctor. Return to your normal activities when your doctor says that it is safe. Keep all follow-up visits. How is this prevented? To have a healthy pregnancy: Do not use drugs. Do not use any medicines unless you ask your doctor if they are safe for you. Talk with your doctor before taking any herbal supplements. Make sure you gain enough weight. Watch for infection. If you think you might have an infection, get it checked right away. Symptoms of infection may include: Fever. Vaginal discharge that smells bad or is not normal. Pain or burning when you pee. Needing to pee urgently. Needing to pee often. Peeing small amounts often. Blood in your pee. Pee that smells bad or unusual. Where to find more information U.S. Department of Health and Human Services   Office on Women's Health: www.womenshealth.gov The American College of Obstetricians and Gynecologists: www.acog.org Centers for Disease Control and Prevention: www.cdc.gov Contact a doctor if: You think you are going into preterm labor. You have symptoms of preterm labor. You have symptoms of infection. Get help  right away if: You are having painful contractions every 5 minutes or less. Your water breaks. Summary Preterm labor is labor that starts before you reach 37 weeks of pregnancy. Your baby may have problems if delivered early. You are more likely to have preterm labor if you have certain medical problems or problems with a pregnancy now or in the past. Some lifestyle factors can also increase the risk. Contact a doctor if you have symptoms of preterm labor. This information is not intended to replace advice given to you by your health care provider. Make sure you discuss any questions you have with your health care provider. Document Revised: 09/19/2020 Document Reviewed: 09/19/2020 Elsevier Patient Education  2023 Elsevier Inc.  

## 2022-09-17 NOTE — Progress Notes (Signed)
ROB doing well, feeling good movement. Discussed PTL , she denies any signs and symptoms currently .CBC repeated today. Will follow up with results. Reviewed Gbs testing next visit. Follow up 2 wks for ROB, 3 wk for growth u/s due to uterine fibroids in pregnancy.   Philip Aspen, CNM

## 2022-09-17 NOTE — Telephone Encounter (Signed)
Pt needs FMLA paper work updated with the dates for her MFM appointments and for the ultrasound appt.  Can you update her paper work?

## 2022-09-18 ENCOUNTER — Other Ambulatory Visit: Payer: Self-pay | Admitting: Certified Nurse Midwife

## 2022-09-18 ENCOUNTER — Encounter: Payer: Self-pay | Admitting: Certified Nurse Midwife

## 2022-09-18 DIAGNOSIS — O99013 Anemia complicating pregnancy, third trimester: Secondary | ICD-10-CM

## 2022-09-18 LAB — CBC
Hematocrit: 25.8 % — ABNORMAL LOW (ref 34.0–46.6)
Hemoglobin: 8.1 g/dL — ABNORMAL LOW (ref 11.1–15.9)
MCH: 20.6 pg — ABNORMAL LOW (ref 26.6–33.0)
MCHC: 31.4 g/dL — ABNORMAL LOW (ref 31.5–35.7)
MCV: 66 fL — ABNORMAL LOW (ref 79–97)
Platelets: 198 10*3/uL (ref 150–450)
RBC: 3.93 x10E6/uL (ref 3.77–5.28)
RDW: 20.2 % — ABNORMAL HIGH (ref 11.7–15.4)
WBC: 8.7 10*3/uL (ref 3.4–10.8)

## 2022-09-20 ENCOUNTER — Inpatient Hospital Stay: Payer: No Typology Code available for payment source | Attending: Oncology | Admitting: Oncology

## 2022-09-20 ENCOUNTER — Inpatient Hospital Stay: Payer: No Typology Code available for payment source

## 2022-09-20 ENCOUNTER — Encounter: Payer: Self-pay | Admitting: Oncology

## 2022-09-20 VITALS — BP 128/89 | HR 105 | Temp 97.2°F | Wt 154.1 lb

## 2022-09-20 DIAGNOSIS — Z3A00 Weeks of gestation of pregnancy not specified: Secondary | ICD-10-CM | POA: Diagnosis not present

## 2022-09-20 DIAGNOSIS — O99013 Anemia complicating pregnancy, third trimester: Secondary | ICD-10-CM | POA: Insufficient documentation

## 2022-09-20 DIAGNOSIS — D573 Sickle-cell trait: Secondary | ICD-10-CM

## 2022-09-20 DIAGNOSIS — D571 Sickle-cell disease without crisis: Secondary | ICD-10-CM | POA: Insufficient documentation

## 2022-09-20 DIAGNOSIS — D509 Iron deficiency anemia, unspecified: Secondary | ICD-10-CM | POA: Diagnosis not present

## 2022-09-20 LAB — CBC WITH DIFFERENTIAL/PLATELET
Abs Immature Granulocytes: 0.23 10*3/uL — ABNORMAL HIGH (ref 0.00–0.07)
Basophils Absolute: 0 10*3/uL (ref 0.0–0.1)
Basophils Relative: 0 %
Eosinophils Absolute: 0.1 10*3/uL (ref 0.0–0.5)
Eosinophils Relative: 1 %
HCT: 25.9 % — ABNORMAL LOW (ref 36.0–46.0)
Hemoglobin: 8.1 g/dL — ABNORMAL LOW (ref 12.0–15.0)
Immature Granulocytes: 2 %
Lymphocytes Relative: 16 %
Lymphs Abs: 1.6 10*3/uL (ref 0.7–4.0)
MCH: 20.5 pg — ABNORMAL LOW (ref 26.0–34.0)
MCHC: 31.3 g/dL (ref 30.0–36.0)
MCV: 65.6 fL — ABNORMAL LOW (ref 80.0–100.0)
Monocytes Absolute: 0.8 10*3/uL (ref 0.1–1.0)
Monocytes Relative: 8 %
Neutro Abs: 7.2 10*3/uL (ref 1.7–7.7)
Neutrophils Relative %: 73 %
Platelets: 202 10*3/uL (ref 150–400)
RBC: 3.95 MIL/uL (ref 3.87–5.11)
RDW: 21.2 % — ABNORMAL HIGH (ref 11.5–15.5)
WBC: 10 10*3/uL (ref 4.0–10.5)
nRBC: 0.3 % — ABNORMAL HIGH (ref 0.0–0.2)

## 2022-09-20 LAB — IRON AND TIBC
Iron: 37 ug/dL (ref 28–170)
Saturation Ratios: 7 % — ABNORMAL LOW (ref 10.4–31.8)
TIBC: 522 ug/dL — ABNORMAL HIGH (ref 250–450)
UIBC: 485 ug/dL

## 2022-09-20 LAB — TECHNOLOGIST SMEAR REVIEW: Plt Morphology: ADEQUATE

## 2022-09-20 LAB — VITAMIN B12: Vitamin B-12: 403 pg/mL (ref 180–914)

## 2022-09-20 LAB — FERRITIN: Ferritin: 9 ng/mL — ABNORMAL LOW (ref 11–307)

## 2022-09-20 LAB — FOLATE: Folate: 21.9 ng/mL (ref >5.9)

## 2022-09-20 NOTE — Progress Notes (Unsigned)
Patient is referred here by [facility] for anemia in pregnancy.

## 2022-09-22 ENCOUNTER — Encounter: Payer: Self-pay | Admitting: Oncology

## 2022-09-22 DIAGNOSIS — D573 Sickle-cell trait: Secondary | ICD-10-CM | POA: Insufficient documentation

## 2022-09-22 NOTE — Addendum Note (Signed)
Addended by: Earlie Server on: 09/22/2022 03:48 PM   Modules accepted: Orders

## 2022-09-22 NOTE — Assessment & Plan Note (Addendum)
Check cbc, cmp, iron tibc ferritin, folate and B12.   Work up is consistent with iron deficiency anemia.  I discussed about option of IV Venofer treatments to ensure that she will have improved iron stores/anemia by the time of delivery. I discussed about the potential risks including but not limited to allergic reactions/infusion reactions including anaphylactic reactions, phlebitis, high blood pressure, wheezing, SOB, skin rash, weight gain,dark urine, leg swelling, back pain, headache, nausea and fatigue, etc. Venofer is assigned to FDA pregnancy category B based on safety studies in pregnancy. Patient agrees with IV Venofer.  Plan IV venofer weekly x 4

## 2022-09-22 NOTE — Assessment & Plan Note (Signed)
Recommend genetic counseling for her future pregnancy

## 2022-09-22 NOTE — Progress Notes (Addendum)
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 previously visualized. ---------------------------------------------------------------------- Cervix Uterus Adnexa  Cervix  Length:            3.5  cm.  Normal appearance by transabdominal scan. ---------------------------------------------------------------------- Myomas  Site                     L(cm)      W(cm)      D(cm)       Location  Left                     8.38       6.67        6.87        Pedunculated  Left LUS                 6.62       5.29       5.73 ----------------------------------------------------------------------  Blood Flow                  RI       PI       Comments ---------------------------------------------------------------------- Impression  Follow up growth due to [place], complete anatomy and known  uterine fibroids.  Normal interval growth with measurements consistent with  dates  Good fetal movement and amniotic fluid volume  Anatomy completed today.  The uterine fibroids again observed. At this time follow up  growth in the third trimester as previously scheduled. ---------------------------------------------------------------------- Recommendations  Follow up growth as previously scheduled. ----------------------------------------------------------------------              Sander Nephew, MD Electronically Signed Final Report   07/03/2022 04:45 pm ----------------------------------------------------------------------      ASSESSMENT & PLAN:   Anemia during pregnancy in third trimester Check cbc, cmp, iron tibc ferritin, folate and B12.   Work up is consistent with iron deficiency anemia.  I discussed about option of IV Venofer treatments to ensure that she will have improved iron stores/anemia by the time of delivery. I discussed about the potential risks including but not limited to allergic reactions/infusion reactions including anaphylactic reactions, phlebitis, high blood pressure, wheezing, SOB, skin rash, weight gain,dark urine, leg swelling, back pain, headache, nausea and fatigue, etc. Venofer is assigned to FDA pregnancy category B based on safety studies in pregnancy. Patient agrees with IV Venofer.  Plan IV venofer weekly x 4   Sickle cell trait ([place]) Recommend genetic counseling for her future pregnancy   Orders Placed This Encounter  Procedures   Folate    Standing Status:   Future    Number of Occurrences:   1    Standing  Expiration Date:   09/21/2023   Ferritin    Standing Status:   Future    Number of Occurrences:   1    Standing Expiration Date:   03/22/2023   Vitamin B12    Standing Status:   Future    Number of Occurrences:   1    Standing Expiration Date:   09/21/2023  CBC with Differential/Platelet    Standing Status:   Future    Number of Occurrences:   1    Standing Expiration Date:   09/21/2023   Iron and TIBC    Standing Status:   Future    Number of Occurrences:   1    Standing Expiration Date:   09/21/2023   Technologist smear review    Order Specific Question:   Clinical information:    Answer:   anemia    All questions were answered. The patient knows to call the clinic with any problems, questions or concerns. cc Philip Aspen, CNM   Return of visit: 2 months.  Thank you for this kind referral and the opportunity to participate in the care of this patient. A copy of today's note is routed to referring provider   Earlie Server, MD, PhD [facility] Hematology Oncology 09/20/2022

## 2022-09-24 ENCOUNTER — Telehealth: Payer: Self-pay

## 2022-09-24 NOTE — Telephone Encounter (Signed)
Mychart message sent to pt to inform her of follow up plan.   Please schedule and inform pt of appts:   Venofer twice during the week of 1/8 ( at least 3 days apart)  Venofer x1 during the week of 1/15  Labs- the end of FEB  MD/ venofer 1-2 days AFTER labs

## 2022-09-24 NOTE — Telephone Encounter (Signed)
-----   Message from Earlie Server, MD sent at 09/22/2022  3:51 PM EST ----- She gets appt for Venofer infusion on 1/5.  Arrange additional Venofer twice during week of 1/8, 3 days apart And another dose of Venofer during week of 1/15 Follow up end of Feb lab prior to MD +/- Venofer- labs are ordered.

## 2022-09-26 ENCOUNTER — Telehealth: Payer: Self-pay

## 2022-09-26 NOTE — Telephone Encounter (Signed)
Cristina Allen called triage line stating when she was last here on 09/17/2022 she had asked to get correct updated FMLA paperwork.  She asked for a call back at [phone]

## 2022-09-26 NOTE — Telephone Encounter (Signed)
Called Cristina Allen back to verify what was she needing for FMLA, left voicemail.

## 2022-09-26 NOTE — Telephone Encounter (Signed)
Cristina Allen is needing the MFM and infusions appointments added to her FMLA. They have been twice a week. If any questions please call Cristina Allen [phone].

## 2022-09-27 NOTE — Telephone Encounter (Signed)
See other note

## 2022-09-30 NOTE — L&D Delivery Note (Signed)
Date of delivery: 10/19/22 Estimated Date of Delivery: 10/26/22 EGA: [redacted]w[redacted]d Hospital Course summary: TDellene Mcgroartyis a 37y.o. G4P0030 @ 358w0dho was admitted on 10/18/2022 in early following SROM at home at 0200. Received pitocin for labor augmentation. Progressed steadily.  Delivery Note At 1:02 AM a viable female was delivered via Vaginal, Spontaneous (Presentation: Right Occiput Transverse).  APGAR: 9, 9;    Anesthesia: Epidural Episiotomy:  none Lacerations:  left periurethral and left sulcus, hemostatic and did not require repair Suture Repair: N/A Est. Blood Loss (mL):  150  Mom to postpartum.  Baby to Couplet care / Skin to Skin.  MaMaggie Allen/20/2024, 2:11 AM    Delivery Narrative  [name] was complete at [phone] began pushing at 0035She delivered a vigorous female infant named TaAida Allen 01[employer] ComputerApgars 9,9. The head followed by shoulders which were delivered without difficulty, and the rest of the body. Baby was immediately placed skin to skin on mother's chest. No Nuchal cord was noted. Delayed cord clamping with cord clamped by CNM and cut by FOB. Cord blood was obtained. Placenta was delivered, primarily by maternal efforts and with some slight cord traction. Placenta was intact, Shultz mechanism, 3 VC. Perineum inspected and found to be intact, there was a left periurethral and left sulcus lacerations that were hemostatic and did not require repair.  AMSTL with IV pitocin per unit protocol. EBL 15013mMother and baby stable.

## 2022-10-03 MED FILL — Iron Sucrose Inj 20 MG/ML (Fe Equiv): INTRAVENOUS | Qty: 10 | Status: AC

## 2022-10-04 ENCOUNTER — Inpatient Hospital Stay: Payer: No Typology Code available for payment source | Attending: Oncology

## 2022-10-04 ENCOUNTER — Encounter: Payer: Self-pay | Admitting: Certified Nurse Midwife

## 2022-10-04 ENCOUNTER — Ambulatory Visit: Payer: No Typology Code available for payment source

## 2022-10-04 ENCOUNTER — Other Ambulatory Visit (HOSPITAL_COMMUNITY)
Admission: RE | Admit: 2022-10-04 | Discharge: 2022-10-04 | Disposition: A | Discharge status: Home / Self Care | Payer: No Typology Code available for payment source | Source: Ambulatory Visit | Attending: Certified Nurse Midwife | Admitting: Certified Nurse Midwife

## 2022-10-04 ENCOUNTER — Ambulatory Visit (INDEPENDENT_AMBULATORY_CARE_PROVIDER_SITE_OTHER): Payer: No Typology Code available for payment source | Admitting: Certified Nurse Midwife

## 2022-10-04 VITALS — BP 121/98 | HR 110 | Temp 97.9°F | Resp 18

## 2022-10-04 VITALS — BP 131/81 | HR 120 | Wt 158.9 lb

## 2022-10-04 DIAGNOSIS — R002 Palpitations: Secondary | ICD-10-CM

## 2022-10-04 DIAGNOSIS — O99013 Anemia complicating pregnancy, third trimester: Secondary | ICD-10-CM | POA: Diagnosis not present

## 2022-10-04 DIAGNOSIS — Z113 Encounter for screening for infections with a predominantly sexual mode of transmission: Secondary | ICD-10-CM | POA: Insufficient documentation

## 2022-10-04 DIAGNOSIS — Z3480 Encounter for supervision of other normal pregnancy, unspecified trimester: Secondary | ICD-10-CM

## 2022-10-04 DIAGNOSIS — Z3A36 36 weeks gestation of pregnancy: Secondary | ICD-10-CM

## 2022-10-04 DIAGNOSIS — D509 Iron deficiency anemia, unspecified: Secondary | ICD-10-CM | POA: Diagnosis present

## 2022-10-04 DIAGNOSIS — D571 Sickle-cell disease without crisis: Secondary | ICD-10-CM | POA: Diagnosis present

## 2022-10-04 DIAGNOSIS — Z3483 Encounter for supervision of other normal pregnancy, third trimester: Secondary | ICD-10-CM

## 2022-10-04 DIAGNOSIS — R000 Tachycardia, unspecified: Secondary | ICD-10-CM

## 2022-10-04 DIAGNOSIS — Z3685 Encounter for antenatal screening for Streptococcus B: Secondary | ICD-10-CM

## 2022-10-04 LAB — POCT URINALYSIS DIPSTICK OB
Bilirubin, UA: NEGATIVE
Blood, UA: NEGATIVE
Glucose, UA: NEGATIVE
Ketones, UA: NEGATIVE
Leukocytes, UA: NEGATIVE
Nitrite, UA: NEGATIVE
POC,PROTEIN,UA: NEGATIVE
Spec Grav, UA: <=1.005 — AB (ref 1.010–1.025)
Urobilinogen, UA: 0.2 E.U./dL
pH, UA: 5 (ref 5.0–8.0)

## 2022-10-04 MED ORDER — SODIUM CHLORIDE 0.9 % IV SOLN
200.0000 mg | Freq: Once | INTRAVENOUS | Status: AC
  Administered 2022-10-04: 200 mg via INTRAVENOUS
  Filled 2022-10-04: qty 200

## 2022-10-04 MED ORDER — SODIUM CHLORIDE 0.9 % IV SOLN
Freq: Once | INTRAVENOUS | Status: AC
  Filled 2022-10-04: qty 250

## 2022-10-04 NOTE — Patient Instructions (Signed)

## 2022-10-04 NOTE — Progress Notes (Signed)
ROB doing well. C/o palpitation s and heart racing. She state she has had issue in the past with palpitation outside of pregnancy but noticed recently that she would have episodes while at rest. She denies chest pain. Discussed cardiology consult, she is in agreement. Orders placed for referral. GBS was positive in NOB urine .Cultures collected today. SVE per pt request -FT.   Pt has appointment today for hematology for iron transfusion.  Follow up 1 wk for ROB.   Philip Aspen, CNM

## 2022-10-07 ENCOUNTER — Inpatient Hospital Stay: Payer: No Typology Code available for payment source

## 2022-10-07 VITALS — BP 122/86 | HR 109 | Temp 99.0°F | Resp 18

## 2022-10-07 DIAGNOSIS — O99013 Anemia complicating pregnancy, third trimester: Secondary | ICD-10-CM | POA: Diagnosis not present

## 2022-10-07 MED ORDER — SODIUM CHLORIDE 0.9 % IV SOLN
Freq: Once | INTRAVENOUS | Status: AC
  Filled 2022-10-07: qty 250

## 2022-10-07 MED ORDER — SODIUM CHLORIDE 0.9 % IV SOLN
200.0000 mg | Freq: Once | INTRAVENOUS | Status: AC
  Administered 2022-10-07: 200 mg via INTRAVENOUS
  Filled 2022-10-07: qty 200

## 2022-10-08 ENCOUNTER — Ambulatory Visit: Payer: No Typology Code available for payment source

## 2022-10-08 ENCOUNTER — Ambulatory Visit (INDEPENDENT_AMBULATORY_CARE_PROVIDER_SITE_OTHER): Payer: No Typology Code available for payment source | Admitting: Obstetrics

## 2022-10-08 VITALS — BP 129/78 | HR 120 | Wt 162.0 lb

## 2022-10-08 DIAGNOSIS — Z3A34 34 weeks gestation of pregnancy: Secondary | ICD-10-CM

## 2022-10-08 DIAGNOSIS — D259 Leiomyoma of uterus, unspecified: Secondary | ICD-10-CM

## 2022-10-08 DIAGNOSIS — Z3403 Encounter for supervision of normal first pregnancy, third trimester: Secondary | ICD-10-CM

## 2022-10-08 DIAGNOSIS — Z3A37 37 weeks gestation of pregnancy: Secondary | ICD-10-CM

## 2022-10-08 DIAGNOSIS — O3413 Maternal care for benign tumor of corpus uteri, third trimester: Secondary | ICD-10-CM

## 2022-10-08 LAB — CERVICOVAGINAL ANCILLARY ONLY
Chlamydia: NEGATIVE
Comment: NEGATIVE
Comment: NORMAL
Neisseria Gonorrhea: NEGATIVE

## 2022-10-08 LAB — POCT URINALYSIS DIPSTICK OB
Bilirubin, UA: NEGATIVE
Glucose, UA: NEGATIVE
Ketones, UA: NEGATIVE
Leukocytes, UA: NEGATIVE
Nitrite, UA: NEGATIVE
POC,PROTEIN,UA: NEGATIVE
Spec Grav, UA: 1.02 (ref 1.010–1.025)
Urobilinogen, UA: 0.2 E.U./dL
pH, UA: 7 (ref 5.0–8.0)

## 2022-10-08 NOTE — Progress Notes (Signed)
ROB at [redacted]w[redacted]d Active baby. Having some BH ctx and cramping but denies regular ctx. Increased mucousy discharge without odor or itching. TNanda Quintonhas recevied 2 iron infusions and is still feeling tired and is tachycardic. She has not been to see the cardiologist yet but plans to. She is having some musculoskeletal pain on her left side. Discussed comfort measures. Reviewed s/s of labor and when to go to the hospital. S>D. Has growth UKoreathis afternoon. Desires SVE today: FT/thick/high. RTC in one week.  MLloyd Huger CNM

## 2022-10-11 ENCOUNTER — Telehealth: Payer: Self-pay | Admitting: *Deleted

## 2022-10-11 ENCOUNTER — Ambulatory Visit (INDEPENDENT_AMBULATORY_CARE_PROVIDER_SITE_OTHER): Payer: No Typology Code available for payment source | Admitting: Cardiology

## 2022-10-11 ENCOUNTER — Inpatient Hospital Stay: Payer: No Typology Code available for payment source

## 2022-10-11 ENCOUNTER — Encounter: Payer: Self-pay | Admitting: Cardiology

## 2022-10-11 ENCOUNTER — Ambulatory Visit: Payer: No Typology Code available for payment source | Attending: Cardiology

## 2022-10-11 VITALS — BP 128/90 | HR 114 | Ht 61.0 in | Wt 160.0 lb

## 2022-10-11 VITALS — BP 122/75 | HR 110 | Temp 97.4°F | Resp 18

## 2022-10-11 DIAGNOSIS — Z3A37 37 weeks gestation of pregnancy: Secondary | ICD-10-CM

## 2022-10-11 DIAGNOSIS — R0602 Shortness of breath: Secondary | ICD-10-CM

## 2022-10-11 DIAGNOSIS — O99013 Anemia complicating pregnancy, third trimester: Secondary | ICD-10-CM

## 2022-10-11 DIAGNOSIS — R002 Palpitations: Secondary | ICD-10-CM | POA: Diagnosis not present

## 2022-10-11 MED ORDER — SODIUM CHLORIDE 0.9 % IV SOLN
Freq: Once | INTRAVENOUS | Status: AC
  Filled 2022-10-11: qty 250

## 2022-10-11 MED ORDER — METOPROLOL TARTRATE 25 MG PO TABS: 12.5000 mg | ORAL_TABLET | Freq: Two times a day (BID) | ORAL | 3 refills | Status: DC | PRN

## 2022-10-11 MED ORDER — SODIUM CHLORIDE 0.9 % IV SOLN
200.0000 mg | Freq: Once | INTRAVENOUS | Status: AC
  Administered 2022-10-11: 200 mg via INTRAVENOUS
  Filled 2022-10-11: qty 200

## 2022-10-11 NOTE — Telephone Encounter (Signed)
-----  Message from Gerarda Gunther sent at 10/11/2022  4:44 PM EST ----- Regarding: RE: 3 DAY ZIO PER DR. Johney Frame Done :-) ----- Message ----- From: Nuala Alpha, LPN Sent: 9/99/6722   4:37 PM EST To: Nuala Alpha, LPN; Shelly A Wells; # Subject: 3 DAY ZIO PER DR. Johney Frame                    This pt is a cardio ob pt we saw in clinic today  Dr. Johney Frame ordered for her to get a 3 day zio for palpitations and sob  Can you please enroll and let me know when you do?  Thanks [employer]

## 2022-10-11 NOTE — Progress Notes (Signed)
Cardio-Obstetrics Clinic  New Evaluation  Date:  10/11/2022   ID:  Cristina, Allen 1985/10/27, MRN [id number]  PCP:  Elgie Collard, [place] Providers Cardiologist:  None  Electrophysiologist:  None       Referring MD: Philip Aspen, CNM   Chief Complaint: palpitations  History of Present Illness:    Cristina Allen is a 37 y.o. female [[id number]] who is being seen today for the evaluation of palpitations at the request of Philip Aspen, [place].   Patient seen by Philip Aspen, CNM. Note reviewed. Patient complained of episodes of palpitations prompting referral to Cardiology at that time.  Today, the patient is 59w6dpregnant. States she has that her heart rates have been increasing over the past several weeks. Has associated shortness of breath as well as palpitations. States she thinks this may be related to her anemia in pregnancy and she was recently started on iron infusions. Has occasional lightheadedness but no syncope. No chest pain, LE edema, or orthopnea.   Has been hydrating and eating regular meals.   Prior CV Studies Reviewed: The following studies were reviewed today: No CV studies  Past Medical History:  Diagnosis Date   Anxiety and depression    Fibroids    GERD (gastroesophageal reflux disease)    Psoriasis of scalp    Sickle cell trait ([place]     Past Surgical History:  Procedure Laterality Date   DILATION AND CURETTAGE OF UTERUS  2006   TONSILLECTOMY        OB History     Gravida  4   Para      Term      Preterm      AB  3   Living  0      SAB  2   IAB  1   Ectopic      Multiple      Live Births                  Current Medications: Current Meds  Medication Sig   ADDERALL XR 25 MG 24 hr capsule Take 1 capsule by mouth as needed.   Butalbital-APAP-Caffeine 50-325-40 MG capsule Take 1-2 capsules by mouth every 6 (six) hours as needed for headache.   cyclobenzaprine  (FLEXERIL) 5 MG tablet Take 1 tablet (5 mg total) by mouth 3 (three) times daily as needed for muscle spasms.   metoprolol tartrate (LOPRESSOR) 25 MG tablet Take 0.5 tablets (12.5 mg total) by mouth 2 (two) times daily as needed (PALPITATIONS).   Prenat-FeFum-Fered-FA-DHA w/oA (VITAPEARL) 30-1.4-200 MG CPCR Take 1 capsule by mouth daily.   Vitamin D, Ergocalciferol, (DRISDOL) 1.25 MG (50000 UNIT) CAPS capsule Take 50,000 Units by mouth once a week.     Allergies:   Patient has no known allergies.   Social History   Socioeconomic History   Marital status: Significant Other    Spouse name: Not on file   Number of children: 0   Years of education: 15   Highest education level: Not on file  Occupational History   Occupation: cMarketing executivefor [employer] Tobacco Use   Smoking status: Never   Smokeless tobacco: Never  Vaping Use   Vaping Use: Never used  Substance and Sexual Activity   Alcohol use: Not Currently    Comment: occasional   Drug use: No   Sexual activity: Yes    Birth control/protection: None  Other Topics Concern   Not on file  Social History Narrative   Not on file   Social Determinants of Health   Financial Resource Strain: Not on file  Food Insecurity: Food Insecurity Present (04/08/2022)   Hunger Vital Sign    Worried About Running Out of Food in the Last Year: Never true    Ran Out of Food in the Last Year: Sometimes true  Transportation Needs: No Transportation Needs (04/08/2022)   PRAPARE - Hydrologist (Medical): No    Lack of Transportation (Non-Medical): No  Physical Activity: Sufficiently Active (04/08/2022)   Exercise Vital Sign    Days of Exercise per Week: 3 days    Minutes of Exercise per Session: 60 min  Stress: Stress Concern Present (04/08/2022)   [place]    Feeling of Stress : To some extent  Social Connections: Unknown (04/08/2022)   Social  Connection and Isolation Panel [NHANES]    Frequency of Communication with Friends and Family: Three times a week    Frequency of Social Gatherings with Friends and Family: Once a week    Attends Religious Services: More than 4 times per year    Active Member of [employer] or Organizations: No    Attends Music therapist: Never    Marital Status: Not on file      Family History  Problem Relation Age of Onset   Healthy Mother    Healthy Father    Arthritis Sister    Diabetes Maternal Grandfather    Hypertension Paternal Grandmother    COPD Paternal Grandmother    Cancer Other    Birth defects Other    Asthma Other    Stroke Neg Hx    Heart disease Neg Hx       ROS:   Please see the history of present illness.     All other systems reviewed and are negative.   Labs/EKG Reviewed:    EKG:   EKG is  ordered today.  The ekg ordered today demonstrates sinus tachycardia with HR 112  Recent Labs: 04/03/2022: BUN 7; Creatinine, Ser 0.68; Potassium 3.7; Sodium 136 09/20/2022: Hemoglobin 8.1; Platelets 202   Recent Lipid Panel No results found for: "CHOL", "TRIG", "HDL", "CHOLHDL", "LDLCALC", "LDLDIRECT"  Physical Exam:    VS:  BP (!) 128/90   Pulse (!) 114   Ht '5\' 1"'$  (1.549 m)   Wt 160 lb (72.6 kg)   LMP  (LMP Unknown)   SpO2 99%   BMI 30.23 kg/m     Wt Readings from Last 3 Encounters:  10/11/22 160 lb (72.6 kg)  10/08/22 162 lb (73.5 kg)  10/04/22 158 lb 14.4 oz (72.1 kg)     GEN:  Well nourished, well developed in no acute distress HEENT: Normal NECK: No JVD; No carotid bruits CARDIAC: Tachycardic, regular, systolic flow murmur RESPIRATORY:  Clear to auscultation without rales, wheezing or rhonchi  ABDOMEN: Gravid, nontender MUSCULOSKELETAL:  No edema; No deformity  SKIN: Warm and dry NEUROLOGIC:  Alert and oriented x 3 PSYCHIATRIC:  Normal affect    Risk Assessment/Risk Calculators:    ASSESSMENT & PLAN:    #Palpitations: Patient with rapid  HR over the past several weeks with associated SOB and palpitations. Suspect this is related to the hemodynamic changes of pregnancy and underlying anemia. She is currently on iron infusions. Given degree of symptoms, will check TTE and cardiac monitor to assess further.  -Check zio monitor -Check TTE -Check d-dimer -Increase  hydration -Slow position changes -Compression socks -Small, frequent meals  #SOB: Suspect this is related to her underlying anemia. Will work-up palpitations as detailed above and check d-dimer as well to ensure normal although much lower suspicion for PE.  #Iron Deficiency Anemia: Managed by OBGYN. Currently on iron infusions.   Patient Instructions  Medication Instructions:   START TAKING METOPROLOL TARTRATE (LOPRESSOR) 12.5 MG BY MOUTH TWICE DAILY AS NEEDED FOR PALPITATIONS  *If you need a refill on your cardiac medications before your next appointment, please call your pharmacy*   Lab: D-dimer next week at [facility] [address] location   Testing/Procedures:  Your physician has requested that you have an OB echocardiogram. Echocardiography is a painless test that uses sound waves to create images of your heart. It provides your doctor with information about the size and shape of your heart and how well your heart's chambers and valves are working. This procedure takes approximately one hour. There are no restrictions for this procedure.  NEEDS TO BE SCHEDULED NEXT WEEK OR EARLY THE FOLLOWING WEEK DUE TO PATIENT ALREADY BEING [redacted] WEEKS PREGNANT AND THIS NEEDS TO BE DONE BEFORE SHE DELIVERS PER DR. Johney Frame  Cardiac-OB patient: to be performed by Lorriane Shire or Romelle Starcher.    Please do NOT wear cologne, perfume, aftershave, or lotions (deodorant is allowed). Please arrive 15 minutes prior to your appointment time.    ZIO XT- Long Term Monitor Instructions  Your physician has requested you wear a ZIO patch monitor for 3 days.  This is a single patch monitor. [employer]  supplies one patch monitor per enrollment. Additional stickers are not available. Please do not apply patch if you will be having a Nuclear Stress Test,  Echocardiogram, Cardiac CT, MRI, or Chest Xray during the period you would be wearing the  monitor. The patch cannot be worn during these tests. You cannot remove and re-apply the  ZIO XT patch monitor.  Your ZIO patch monitor will be mailed 3 day USPS to your address on file. It may take 3-5 days  to receive your monitor after you have been enrolled.  Once you have received your monitor, please review the enclosed instructions. Your monitor  has already been registered assigning a specific monitor serial # to you.  Billing and Patient Assistance Program Information  We have supplied [employer] with any of your insurance information on file for billing purposes. [employer] offers a sliding scale Patient Assistance Program for patients that do not have  insurance, or whose insurance does not completely cover the cost of the ZIO monitor.  You must apply for the Patient Assistance Program to qualify for this discounted rate.  To apply, please call [employer] at [phone], select option 4, select option 2, ask to apply for  Patient Assistance Program. Theodore Demark will ask your household income, and how many people  are in your household. They will quote your out-of-pocket cost based on that information.  [employer] will also be able to set up a 11-month interest-free payment plan if needed.  Applying the monitor   Shave hair from upper left chest.  Hold abrader disc by orange tab. Rub abrader in 40 strokes over the upper left chest as  indicated in your monitor instructions.  Clean area with 4 enclosed alcohol pads. Let dry.  Apply patch as indicated in monitor instructions. Patch will be placed under collarbone on left  side of chest with arrow pointing upward.  Rub patch adhesive wings for 2 minutes. Remove white label marked "1". Remove  the white   label marked "2". Rub patch adhesive wings for 2 additional minutes.  While looking in a mirror, press and release button in center of patch. A small green light will  flash 3-4 times. This will be your only indicator that the monitor has been turned on.  Do not shower for the first 24 hours. You may shower after the first 24 hours.  Press the button if you feel a symptom. You will hear a small click. Record Date, Time and  Symptom in the Patient Logbook.  When you are ready to remove the patch, follow instructions on the last 2 pages of Patient  Logbook. Stick patch monitor onto the last page of Patient Logbook.  Place Patient Logbook in the blue and white box. Use locking tab on box and tape box closed  securely. The blue and white box has prepaid postage on it. Please place it in the mailbox as  soon as possible. Your physician should have your test results approximately 7 days after the  monitor has been mailed back to [facility].  Call [facility] at [phone] if you have questions regarding  your ZIO XT patch monitor. Call them immediately if you see an orange light blinking on your  monitor.  If your monitor falls off in less than 4 days, contact our Monitor department at [phone].  If your monitor becomes loose or falls off after 4 days call [employer] at [phone] for  suggestions on securing your monitor    Follow-Up:  AS NEEDED WITH DR. Johney Frame HERE AT [place]:  No follow-ups on file.   Medication Adjustments/Labs and Tests Ordered: Current medicines are reviewed at length with the patient today.  Concerns regarding medicines are outlined above.  Tests Ordered: Orders Placed This Encounter  Procedures   LONG TERM MONITOR (3-14 DAYS)   EKG 12-Lead   ECHOCARDIOGRAM COMPLETE   Medication Changes: Meds ordered this encounter  Medications   metoprolol tartrate (LOPRESSOR) 25 MG tablet    Sig: Take 0.5 tablets (12.5 mg  total) by mouth 2 (two) times daily as needed (PALPITATIONS).    Dispense:  30 tablet    Refill:  3

## 2022-10-11 NOTE — Patient Instructions (Addendum)
Medication Instructions:   START TAKING METOPROLOL TARTRATE (LOPRESSOR) 12.5 MG BY MOUTH TWICE DAILY AS NEEDED FOR PALPITATIONS  *If you need a refill on your cardiac medications before your next appointment, please call your pharmacy*   Lab: D-dimer next Monday 10/14/22 at a [place] near you in [place] --order was released so that [facility] can see this.   Testing/Procedures:  Your physician has requested that you have an OB echocardiogram. Echocardiography is a painless test that uses sound waves to create images of your heart. It provides your doctor with information about the size and shape of your heart and how well your heart's chambers and valves are working. This procedure takes approximately one hour. There are no restrictions for this procedure.  NEEDS TO BE SCHEDULED NEXT WEEK OR EARLY THE FOLLOWING WEEK DUE TO PATIENT ALREADY BEING [redacted] WEEKS PREGNANT AND THIS NEEDS TO BE DONE BEFORE SHE DELIVERS PER DR. Johney Frame  Cardiac-OB patient: to be performed by Lorriane Shire or Romelle Starcher.    Please do NOT wear cologne, perfume, aftershave, or lotions (deodorant is allowed). Please arrive 15 minutes prior to your appointment time.    ZIO XT- Long Term Monitor Instructions  Your physician has requested you wear a ZIO patch monitor for 3 days.  This is a single patch monitor. [employer] supplies one patch monitor per enrollment. Additional stickers are not available. Please do not apply patch if you will be having a Nuclear Stress Test,  Echocardiogram, Cardiac CT, MRI, or Chest Xray during the period you would be wearing the  monitor. The patch cannot be worn during these tests. You cannot remove and re-apply the  ZIO XT patch monitor.  Your ZIO patch monitor will be mailed 3 day USPS to your address on file. It may take 3-5 days  to receive your monitor after you have been enrolled.  Once you have received your monitor, please review the enclosed instructions. Your monitor  has already been  registered assigning a specific monitor serial # to you.  Billing and Patient Assistance Program Information  We have supplied [employer] with any of your insurance information on file for billing purposes. [employer] offers a sliding scale Patient Assistance Program for patients that do not have  insurance, or whose insurance does not completely cover the cost of the ZIO monitor.  You must apply for the Patient Assistance Program to qualify for this discounted rate.  To apply, please call [employer] at [phone], select option 4, select option 2, ask to apply for  Patient Assistance Program. Theodore Demark will ask your household income, and how many people  are in your household. They will quote your out-of-pocket cost based on that information.  [employer] will also be able to set up a 71-month interest-free payment plan if needed.  Applying the monitor   Shave hair from upper left chest.  Hold abrader disc by orange tab. Rub abrader in 40 strokes over the upper left chest as  indicated in your monitor instructions.  Clean area with 4 enclosed alcohol pads. Let dry.  Apply patch as indicated in monitor instructions. Patch will be placed under collarbone on left  side of chest with arrow pointing upward.  Rub patch adhesive wings for 2 minutes. Remove white label marked "1". Remove the white  label marked "2". Rub patch adhesive wings for 2 additional minutes.  While looking in a mirror, press and release button in center of patch. A small green light will  flash 3-4 times. This will be your only  indicator that the monitor has been turned on.  Do not shower for the first 24 hours. You may shower after the first 24 hours.  Press the button if you feel a symptom. You will hear a small click. Record Date, Time and  Symptom in the Patient Logbook.  When you are ready to remove the patch, follow instructions on the last 2 pages of Patient  Logbook. Stick patch monitor onto the last page of Patient  Logbook.  Place Patient Logbook in the blue and white box. Use locking tab on box and tape box closed  securely. The blue and white box has prepaid postage on it. Please place it in the mailbox as  soon as possible. Your physician should have your test results approximately 7 days after the  monitor has been mailed back to [facility].  Call [place] at [phone] if you have questions regarding  your ZIO XT patch monitor. Call them immediately if you see an orange light blinking on your  monitor.  If your monitor falls off in less than 4 days, contact our Monitor department at [phone].  If your monitor becomes loose or falls off after 4 days call [employer] at [phone] for  suggestions on securing your monitor    Follow-Up:  AS NEEDED WITH DR. Johney Frame HERE AT [facility]

## 2022-10-11 NOTE — Progress Notes (Unsigned)
Enrolled patient for a 3 day Zio XT monitor to be mailed to patients home  

## 2022-10-14 ENCOUNTER — Telehealth: Payer: Self-pay | Admitting: *Deleted

## 2022-10-14 NOTE — Telephone Encounter (Signed)
-----  Message from Armando Gang sent at 10/11/2022  4:46 PM EST ----- Regarding: Ob  Echo Need next week or early the following week due to patient is [redacted] wks pregnant and need to be done before she delivers

## 2022-10-14 NOTE — Telephone Encounter (Signed)
Pts OB ECHO is scheduled for 10/16/22 at 1130. Pt made aware of appt date and time by [facility] Scheduling.

## 2022-10-15 ENCOUNTER — Inpatient Hospital Stay: Payer: No Typology Code available for payment source

## 2022-10-15 VITALS — BP 137/89 | HR 114 | Resp 18

## 2022-10-15 DIAGNOSIS — O99013 Anemia complicating pregnancy, third trimester: Secondary | ICD-10-CM

## 2022-10-15 MED ORDER — SODIUM CHLORIDE 0.9 % IV SOLN
Freq: Once | INTRAVENOUS | Status: AC
  Filled 2022-10-15: qty 250

## 2022-10-15 MED ORDER — SODIUM CHLORIDE 0.9 % IV SOLN
200.0000 mg | Freq: Once | INTRAVENOUS | Status: AC
  Administered 2022-10-15: 200 mg via INTRAVENOUS
  Filled 2022-10-15: qty 200

## 2022-10-15 MED ORDER — SODIUM CHLORIDE 0.9 % IV SOLN
INTRAVENOUS | Status: DC
  Filled 2022-10-15: qty 250

## 2022-10-15 NOTE — Patient Instructions (Signed)
Iron Sucrose Injection What is this medication? IRON SUCROSE (EYE ern SOO krose) treats low levels of iron (iron deficiency anemia) in people with kidney disease. Iron is a mineral that plays an important role in making red blood cells, which carry oxygen from your lungs to the rest of your body. This medicine may be used for other purposes; ask your health care provider or pharmacist if you have questions. COMMON BRAND NAME(S): Venofer What should I tell my care team before I take this medication? They need to know if you have any of these conditions: Anemia not caused by low iron levels Heart disease High levels of iron in the blood Kidney disease Liver disease An unusual or allergic reaction to iron, other medications, foods, dyes, or preservatives Pregnant or trying to get pregnant Breastfeeding How should I use this medication? This medication is for infusion into a vein. It is given in a hospital or clinic setting. Talk to your care team about the use of this medication in children. While this medication may be prescribed for children as young as 2 years for selected conditions, precautions do apply. Overdosage: If you think you have taken too much of this medicine contact a poison control center or emergency room at once. NOTE: This medicine is only for you. Do not share this medicine with others. What if I miss a dose? Keep appointments for follow-up doses. It is important not to miss your dose. Call your care team if you are unable to keep an appointment. What may interact with this medication? Do not take this medication with any of the following: Deferoxamine Dimercaprol Other iron products This medication may also interact with the following: Chloramphenicol Deferasirox This list may not describe all possible interactions. Give your health care provider a list of all the medicines, herbs, non-prescription drugs, or dietary supplements you use. Also tell them if you smoke,  drink alcohol, or use illegal drugs. Some items may interact with your medicine. What should I watch for while using this medication? Visit your care team regularly. Tell your care team if your symptoms do not start to get better or if they get worse. You may need blood work done while you are taking this medication. You may need to follow a special diet. Talk to your care team. Foods that contain iron include: whole grains/cereals, dried fruits, beans, or peas, leafy green vegetables, and organ meats (liver, kidney). What side effects may I notice from receiving this medication? Side effects that you should report to your care team as soon as possible: Allergic reactions--skin rash, itching, hives, swelling of the face, lips, tongue, or throat Low blood pressure--dizziness, feeling faint or lightheaded, blurry vision Shortness of breath Side effects that usually do not require medical attention (report to your care team if they continue or are bothersome): Flushing Headache Joint pain Muscle pain Nausea Pain, redness, or irritation at injection site This list may not describe all possible side effects. Call your doctor for medical advice about side effects. You may report side effects to FDA at 1-800-FDA-1088. Where should I keep my medication? This medication is given in a hospital or clinic and will not be stored at home. NOTE: This sheet is a summary. It may not cover all possible information. If you have questions about this medicine, talk to your doctor, pharmacist, or health care provider.  2023 Elsevier/Gold Standard (2020-12-28 00:00:00)

## 2022-10-16 ENCOUNTER — Ambulatory Visit (HOSPITAL_COMMUNITY): Payer: No Typology Code available for payment source | Attending: Cardiology

## 2022-10-16 ENCOUNTER — Ambulatory Visit (INDEPENDENT_AMBULATORY_CARE_PROVIDER_SITE_OTHER): Payer: No Typology Code available for payment source | Admitting: Obstetrics and Gynecology

## 2022-10-16 ENCOUNTER — Encounter: Payer: Self-pay | Admitting: Obstetrics and Gynecology

## 2022-10-16 VITALS — BP 131/87 | HR 128

## 2022-10-16 DIAGNOSIS — Z3483 Encounter for supervision of other normal pregnancy, third trimester: Secondary | ICD-10-CM

## 2022-10-16 DIAGNOSIS — R002 Palpitations: Secondary | ICD-10-CM | POA: Insufficient documentation

## 2022-10-16 DIAGNOSIS — O99013 Anemia complicating pregnancy, third trimester: Secondary | ICD-10-CM | POA: Insufficient documentation

## 2022-10-16 DIAGNOSIS — Z3480 Encounter for supervision of other normal pregnancy, unspecified trimester: Secondary | ICD-10-CM

## 2022-10-16 DIAGNOSIS — R0602 Shortness of breath: Secondary | ICD-10-CM | POA: Insufficient documentation

## 2022-10-16 DIAGNOSIS — Z3A38 38 weeks gestation of pregnancy: Secondary | ICD-10-CM

## 2022-10-16 LAB — POCT URINALYSIS DIPSTICK OB
Bilirubin, UA: NEGATIVE
Blood, UA: NEGATIVE
Glucose, UA: NEGATIVE
Leukocytes, UA: NEGATIVE
Nitrite, UA: NEGATIVE
POC,PROTEIN,UA: NEGATIVE
Spec Grav, UA: 1.01 (ref 1.010–1.025)
Urobilinogen, UA: 0.2 E.U./dL — NL
pH, UA: 7 (ref 5.0–8.0)

## 2022-10-16 LAB — ECHOCARDIOGRAM COMPLETE
Area-P 1/2: 4.88 cm2
S' Lateral: 2.45 cm

## 2022-10-16 NOTE — Progress Notes (Signed)
ROB: Has pelvic pressure/discomfort.  Denies real contractions.  In the middle of Holter monitor with cardiac workup.  Previous iron infusions.  Signs and symptoms of labor discussed.  Growth ultrasound shows 29%.  History of uterine fibroids.  Cervix closed firm today.

## 2022-10-18 ENCOUNTER — Encounter: Payer: Self-pay | Admitting: Obstetrics and Gynecology

## 2022-10-18 ENCOUNTER — Other Ambulatory Visit: Payer: Self-pay

## 2022-10-18 ENCOUNTER — Inpatient Hospital Stay: Payer: No Typology Code available for payment source | Admitting: Anesthesiology

## 2022-10-18 ENCOUNTER — Encounter: Payer: Self-pay | Admitting: Oncology

## 2022-10-18 ENCOUNTER — Inpatient Hospital Stay
Admission: EM | Admit: 2022-10-18 | Discharge: 2022-10-20 | DRG: 807 | Disposition: A | Discharge status: Home / Self Care | Payer: No Typology Code available for payment source | Attending: Certified Nurse Midwife | Admitting: Certified Nurse Midwife

## 2022-10-18 DIAGNOSIS — D259 Leiomyoma of uterus, unspecified: Secondary | ICD-10-CM | POA: Diagnosis present

## 2022-10-18 DIAGNOSIS — O99824 Streptococcus B carrier state complicating childbirth: Secondary | ICD-10-CM | POA: Diagnosis present

## 2022-10-18 DIAGNOSIS — O99892 Other specified diseases and conditions complicating childbirth: Secondary | ICD-10-CM | POA: Diagnosis present

## 2022-10-18 DIAGNOSIS — O4202 Full-term premature rupture of membranes, onset of labor within 24 hours of rupture: Secondary | ICD-10-CM | POA: Diagnosis not present

## 2022-10-18 DIAGNOSIS — O3413 Maternal care for benign tumor of corpus uteri, third trimester: Secondary | ICD-10-CM | POA: Diagnosis present

## 2022-10-18 DIAGNOSIS — D509 Iron deficiency anemia, unspecified: Secondary | ICD-10-CM | POA: Diagnosis not present

## 2022-10-18 DIAGNOSIS — Z3A38 38 weeks gestation of pregnancy: Secondary | ICD-10-CM

## 2022-10-18 DIAGNOSIS — O9902 Anemia complicating childbirth: Principal | ICD-10-CM | POA: Diagnosis present

## 2022-10-18 DIAGNOSIS — D573 Sickle-cell trait: Secondary | ICD-10-CM | POA: Diagnosis present

## 2022-10-18 DIAGNOSIS — Z3A39 39 weeks gestation of pregnancy: Secondary | ICD-10-CM | POA: Diagnosis not present

## 2022-10-18 DIAGNOSIS — O479 False labor, unspecified: Secondary | ICD-10-CM | POA: Diagnosis present

## 2022-10-18 DIAGNOSIS — R000 Tachycardia, unspecified: Secondary | ICD-10-CM | POA: Diagnosis present

## 2022-10-18 DIAGNOSIS — Z3480 Encounter for supervision of other normal pregnancy, unspecified trimester: Secondary | ICD-10-CM

## 2022-10-18 DIAGNOSIS — O99820 Streptococcus B carrier state complicating pregnancy: Secondary | ICD-10-CM | POA: Diagnosis not present

## 2022-10-18 DIAGNOSIS — O26893 Other specified pregnancy related conditions, third trimester: Secondary | ICD-10-CM | POA: Diagnosis present

## 2022-10-18 LAB — CBC
HCT: 32.5 % — ABNORMAL LOW (ref 36.0–46.0)
Hemoglobin: 10 g/dL — ABNORMAL LOW (ref 12.0–15.0)
MCH: 21.8 pg — ABNORMAL LOW (ref 26.0–34.0)
MCHC: 30.8 g/dL (ref 30.0–36.0)
MCV: 71 fL — ABNORMAL LOW (ref 80.0–100.0)
Platelets: 217 10*3/uL (ref 150–400)
RBC: 4.58 MIL/uL (ref 3.87–5.11)
RDW: 29.3 % — ABNORMAL HIGH (ref 11.5–15.5)
WBC: 10.3 10*3/uL (ref 4.0–10.5)
nRBC: 0.3 % — ABNORMAL HIGH (ref 0.0–0.2)

## 2022-10-18 LAB — TYPE AND SCREEN
ABO/RH(D): O POS
Antibody Screen: NEGATIVE

## 2022-10-18 LAB — SYPHILIS: RPR W/REFLEX TO RPR TITER AND TREPONEMAL ANTIBODIES, TRADITIONAL SCREENING AND DIAGNOSIS ALGORITHM: RPR Ser Ql: NONREACTIVE

## 2022-10-18 MED ORDER — EPHEDRINE 5 MG/ML INJ: 10.0000 mg | INTRAVENOUS | Status: DC | PRN

## 2022-10-18 MED ORDER — TERBUTALINE SULFATE 1 MG/ML IJ SOLN: 0.2500 mg | Freq: Once | INTRAMUSCULAR | Status: DC | PRN

## 2022-10-18 MED ORDER — OXYTOCIN-SODIUM CHLORIDE 30-0.9 UT/500ML-% IV SOLN: 1.0000 m[IU]/min | INTRAVENOUS | Status: DC

## 2022-10-18 MED ORDER — ACETAMINOPHEN 325 MG PO TABS: 650.0000 mg | ORAL_TABLET | ORAL | Status: DC | PRN

## 2022-10-18 MED ORDER — PHENYLEPHRINE 80 MCG/ML (10ML) SYRINGE FOR IV PUSH (FOR BLOOD PRESSURE SUPPORT): 80.0000 ug | PREFILLED_SYRINGE | INTRAVENOUS | Status: DC | PRN

## 2022-10-18 MED ORDER — ONDANSETRON HCL 4 MG/2ML IJ SOLN
4.0000 mg | Freq: Four times a day (QID) | INTRAMUSCULAR | Status: DC | PRN
  Administered 2022-10-18: 4 mg via INTRAVENOUS
  Filled 2022-10-18: qty 2

## 2022-10-18 MED ORDER — SODIUM CHLORIDE 0.9 % IV SOLN
INTRAVENOUS | Status: AC
  Filled 2022-10-18: qty 5

## 2022-10-18 MED ORDER — LACTATED RINGERS IV SOLN: 500.0000 mL | Freq: Once | INTRAVENOUS | Status: DC

## 2022-10-18 MED ORDER — LACTATED RINGERS IV SOLN: INTRAVENOUS | Status: DC

## 2022-10-18 MED ORDER — OXYTOCIN-SODIUM CHLORIDE 30-0.9 UT/500ML-% IV SOLN
INTRAVENOUS | Status: AC
  Administered 2022-10-18: 2 m[IU]/min via INTRAVENOUS
  Filled 2022-10-18: qty 500

## 2022-10-18 MED ORDER — FENTANYL CITRATE (PF) 100 MCG/2ML IJ SOLN
50.0000 ug | INTRAMUSCULAR | Status: DC | PRN
  Administered 2022-10-18 (×3): 100 ug via INTRAVENOUS
  Filled 2022-10-18 (×3): qty 2

## 2022-10-18 MED ORDER — OXYCODONE-ACETAMINOPHEN 5-325 MG PO TABS: 1.0000 | ORAL_TABLET | ORAL | Status: DC | PRN

## 2022-10-18 MED ORDER — LIDOCAINE HCL (PF) 1 % IJ SOLN: 30.0000 mL | INTRAMUSCULAR | Status: DC | PRN

## 2022-10-18 MED ORDER — FENTANYL-BUPIVACAINE-NACL 0.5-0.125-0.9 MG/250ML-% EP SOLN
12.0000 mL/h | EPIDURAL | Status: DC | PRN
  Administered 2022-10-18: 12 mL/h via EPIDURAL
  Filled 2022-10-18: qty 250

## 2022-10-18 MED ORDER — PENICILLIN G POT IN DEXTROSE 60000 UNIT/ML IV SOLN
3.0000 10*6.[IU] | INTRAVENOUS | Status: DC
  Administered 2022-10-18 – 2022-10-19 (×4): 3 10*6.[IU] via INTRAVENOUS
  Filled 2022-10-18 (×5): qty 50

## 2022-10-18 MED ORDER — BUTORPHANOL TARTRATE 2 MG/ML IJ SOLN
INTRAMUSCULAR | Status: AC
  Filled 2022-10-18: qty 1

## 2022-10-18 MED ORDER — LIDOCAINE HCL (PF) 1 % IJ SOLN
INTRAMUSCULAR | Status: AC
  Filled 2022-10-18: qty 30

## 2022-10-18 MED ORDER — OXYTOCIN-SODIUM CHLORIDE 30-0.9 UT/500ML-% IV SOLN
2.5000 [IU]/h | INTRAVENOUS | Status: DC
  Administered 2022-10-19: 2.5 [IU]/h via INTRAVENOUS
  Filled 2022-10-18: qty 500

## 2022-10-18 MED ORDER — OXYCODONE-ACETAMINOPHEN 5-325 MG PO TABS: 2.0000 | ORAL_TABLET | ORAL | Status: DC | PRN

## 2022-10-18 MED ORDER — SODIUM CHLORIDE 0.9 % IV SOLN
5.0000 10*6.[IU] | Freq: Once | INTRAVENOUS | Status: AC
  Administered 2022-10-18: 5 10*6.[IU] via INTRAVENOUS
  Filled 2022-10-18: qty 5

## 2022-10-18 MED ORDER — MISOPROSTOL 200 MCG PO TABS
ORAL_TABLET | ORAL | Status: AC
  Filled 2022-10-18: qty 4

## 2022-10-18 MED ORDER — SODIUM CHLORIDE 0.9 % IV SOLN
INTRAVENOUS | Status: DC | PRN
  Administered 2022-10-18 (×2): 4 mL via EPIDURAL

## 2022-10-18 MED ORDER — AMMONIA AROMATIC IN INHA
RESPIRATORY_TRACT | Status: AC
  Filled 2022-10-18: qty 10

## 2022-10-18 MED ORDER — LIDOCAINE HCL (PF) 1 % IJ SOLN
INTRAMUSCULAR | Status: DC | PRN
  Administered 2022-10-18: 1 mL via SUBCUTANEOUS

## 2022-10-18 MED ORDER — OXYTOCIN BOLUS FROM INFUSION
333.0000 mL | Freq: Once | INTRAVENOUS | Status: AC
  Administered 2022-10-19: 333 mL via INTRAVENOUS

## 2022-10-18 MED ORDER — DIPHENHYDRAMINE HCL 50 MG/ML IJ SOLN: 12.5000 mg | INTRAMUSCULAR | Status: DC | PRN

## 2022-10-18 MED ORDER — BUTORPHANOL TARTRATE 2 MG/ML IJ SOLN
1.0000 mg | Freq: Once | INTRAMUSCULAR | Status: AC
  Administered 2022-10-18: 1 mg via INTRAVENOUS

## 2022-10-18 MED ORDER — SOD CITRATE-CITRIC ACID 500-334 MG/5ML PO SOLN: 30.0000 mL | ORAL | Status: DC | PRN

## 2022-10-18 MED ORDER — LIDOCAINE-EPINEPHRINE (PF) 1.5 %-1:200000 IJ SOLN
INTRAMUSCULAR | Status: DC | PRN
  Administered 2022-10-18: 3 mL via EPIDURAL

## 2022-10-18 MED ORDER — OXYTOCIN 10 UNIT/ML IJ SOLN
INTRAMUSCULAR | Status: AC
  Filled 2022-10-18: qty 2

## 2022-10-18 MED ORDER — LACTATED RINGERS IV SOLN
500.0000 mL | INTRAVENOUS | Status: DC | PRN
  Administered 2022-10-18: 1000 mL via INTRAVENOUS

## 2022-10-18 NOTE — Progress Notes (Signed)
  Labor Progress Note   37 y.o. G4P0030 @ [redacted]w[redacted]d contractions have spaced out, is not in active labor, has been srom'd for 10 hours   Subjective:  States contractions feel like strong cramps  Objective:  BP (!) 137/95   Pulse (!) 102   Temp 98.2 F (36.8 C) (Oral)   Resp 16   Ht '5\' 1"'$  (1.549 m)   Wt 73.5 kg   LMP  (LMP Unknown)   BMI 30.61 kg/m  Abd: gravid SVE: 4cm/100%/-2  EFM: 130, mod variability, pos accels, no decels Toco: Ucs q3-843m   Assessment  G4P0030 @ 3839w6drly labor SROM 10hr, afebrile VSS FHR Cat I   Plan:   1. Discussed expectant management vs active management, recommended starting pitocin. Patient in agreement.  2. Plan SVE after 3-4 hours of regular/strong contractions or sooner prn  All discussed with patient  Mar[place]altimoreNP [facility] 10/18/2022  1:05 PM

## 2022-10-18 NOTE — Progress Notes (Signed)
  Labor Progress Note   37 y.o. G4P0030 @ [redacted]w[redacted]d Piyocin was started about noon for augmentation. Is currently at 1854mmin.   Subjective:  Feels contractions are stronger, coping well, breathing through contraction.   Objective:  BP (!) 136/96   Pulse (!) 109   Temp 98.1 F (36.7 C) (Oral)   Resp 16   Ht '5\' 1"'$  (1.549 m)   Wt 73.5 kg   LMP  (LMP Unknown)   BMI 30.61 kg/m  Abd: gravid SVE: 5cm/100%/-2, vertex  EFM: 135, pos accels, occasional variables Toco: Ucs q2-54m65m pit at 51m44mn   Assessment  G4P0030 @ 37w632w6dy labor SROM 14 hr, afebrile FHR Cat I   Plan:   1. Continue augmentation with pitocin titration,  has made change since last SVE  All discussed with patient  MarciMaggie Font FNP [place] 10/18/2022  5:06 PM

## 2022-10-18 NOTE — Progress Notes (Signed)
  Labor Progress Note   37 y.o. G4P0030 @ [redacted]w[redacted]d SROM'd at 0200 at home, had labor augmentation with pitocin starting around noon. Just received an epidural for pain management.   Subjective:  Feeling more pain on her left side, has more relief on right side  Objective:  BP (!) 136/92   Pulse (!) 110   Temp 98.1 F (36.7 C) (Oral)   Resp 16   Ht '5\' 1"'$  (1.549 m)   Wt 73.5 kg   LMP  (LMP Unknown)   BMI 30.61 kg/m  Abd: gravid SVE: 7cm/100%/-2, vertex, more cervix on left side   EFM: 140, mod variability, pos accels, some early decels Toco: Ucs q2-344m, Pit at 2064min   Assessment  G4P0030 @ 38w48w6dnsition Mildly elevated BP FHR Cat I   Plan:   1. Expectant management, continue with pitocin at 20mu58m 2. Anticipate NSVB   All discussed with patient  MarciMaggie Font FNP [facility] 10/18/2022  9:56 PM

## 2022-10-18 NOTE — Progress Notes (Signed)
  Labor Progress Note   37 y.o. G4P0030 @ [redacted]w[redacted]d admitted after SROM at home ar 0200.    Subjective:  Feels contractions are getting stronger. Has had one dose of stadol followed by one dose of fentanyl about 0800 for pain management. Coping well currently.  Objective:  BP 130/81 (BP Location: Left Arm)   Pulse (!) 109   Temp 97.8 F (36.6 C) (Oral)   Resp 16   Ht '5\' 1"'$  (1.549 m)   Wt 73.5 kg   LMP  (LMP Unknown)   BMI 30.61 kg/m  Abd: gravid SVE: 3.5cm/100% about 0800 per RN  EFM: 130, min to mod variability, no accels, rare variable decels Toco: Ucs q3-867m   Assessment  G4P0030 @ 3855w6drly labor Tachycardic- ongoing, currently wearing zio patch FHR Cat II but overall reassuring  SROM 8hr, afebrile    Plan:   1. Continuous EFM 2. Plan SVE around noon, has made slow cervical change 2. Expectant management  All discussed with patient  MarMaggie FontM, FNPIce[place]/[facility] 10/18/2022  10:17 AM

## 2022-10-18 NOTE — Anesthesia Procedure Notes (Addendum)
Epidural Patient location during procedure: OB Start time: 10/18/2022 8:38 PM End time: 10/18/2022 8:41 PM  Staffing Anesthesiologist: Zae Kirtz, Precious Haws, MD Performed: anesthesiologist   Preanesthetic Checklist Completed: patient identified, IV checked, site marked, risks and benefits discussed, surgical consent, monitors and equipment checked, pre-op evaluation and timeout performed  Epidural Patient position: sitting Prep: ChloraPrep Patient monitoring: heart rate, continuous pulse ox and blood pressure Approach: midline Location: L3-L4 Injection technique: LOR saline  Needle:  Needle type: Tuohy  Needle gauge: 17 G Needle length: 9 cm and 9 Needle insertion depth: 5 cm Catheter type: closed end flexible Catheter size: 19 Gauge Catheter at skin depth: 11 cm Test dose: negative and 1.5% lidocaine with Epi 1:200 K  Assessment Sensory level: T10 Events: blood not aspirated, no cerebrospinal fluid, injection not painful, no injection resistance, no paresthesia and negative IV test  Additional Notes 1 attempt Pt. Evaluated and documentation done after procedure finished. Patient identified. Risks/Benefits/Options discussed with patient including but not limited to bleeding, infection, nerve damage, paralysis, failed block, incomplete pain control, headache, blood pressure changes, nausea, vomiting, reactions to medication both or allergic, itching and postpartum back pain. Confirmed with bedside nurse the patient's most recent platelet count. Confirmed with patient that they are not currently taking any anticoagulation, have any bleeding history or any family history of bleeding disorders. Patient expressed understanding and wished to proceed. All questions were answered. Sterile technique was used throughout the entire procedure. Please see nursing notes for vital signs. Test dose was given through epidural catheter and negative prior to continuing to dose epidural or start  infusion. Warning signs of high block given to the patient including shortness of breath, tingling/numbness in hands, complete motor block, or any concerning symptoms with instructions to call for help. Patient was given instructions on fall risk and not to get out of bed. All questions and concerns addressed with instructions to call with any issues or inadequate analgesia.    Patient tolerated the insertion well without immediate complications.Reason for block:procedure for pain

## 2022-10-18 NOTE — Anesthesia Preprocedure Evaluation (Signed)
Anesthesia Evaluation  Patient identified by MRN, date of birth, ID band Patient awake    Reviewed: Allergy & Precautions, NPO status , Patient's Chart, lab work & pertinent test results  History of Anesthesia Complications Negative for: history of anesthetic complications  Airway Mallampati: III  TM Distance: >3 FB Neck ROM: full    Dental  (+) Chipped   Pulmonary neg pulmonary ROS   Pulmonary exam normal        Cardiovascular Exercise Tolerance: Good hypertension, + dysrhythmias Supra Ventricular Tachycardia      Neuro/Psych  PSYCHIATRIC DISORDERS         GI/Hepatic ,GERD  Controlled,,  Endo/Other    Renal/GU   negative genitourinary   Musculoskeletal   Abdominal   Peds  Hematology negative hematology ROS (+)   Anesthesia Other Findings Past Medical History: No date: Anxiety and depression No date: Fibroids No date: GERD (gastroesophageal reflux disease) No date: Psoriasis of scalp No date: Sickle cell trait ([place])  Past Surgical History: 2006: DILATION AND CURETTAGE OF UTERUS No date: TONSILLECTOMY  BMI    Body Mass Index: 30.61 kg/m      Reproductive/Obstetrics (+) Pregnancy                             Anesthesia Physical Anesthesia Plan  ASA: 3  Anesthesia Plan: Epidural   Post-op Pain Management:    Induction:   PONV Risk Score and Plan:   Airway Management Planned: Natural Airway  Additional Equipment:   Intra-op Plan:   Post-operative Plan:   Informed Consent: I have reviewed the patients History and Physical, chart, labs and discussed the procedure including the risks, benefits and alternatives for the proposed anesthesia with the patient or authorized representative who has indicated his/her understanding and acceptance.     Dental Advisory Given  Plan Discussed with: Anesthesiologist  Anesthesia Plan Comments: (Patient reports no bleeding  problems and no anticoagulant use.   Patient consented for risks of anesthesia including but not limited to:  - adverse reactions to medications - risk of bleeding, infection and or nerve damage from epidural that could lead to paralysis - risk of headache or failed epidural - nerve damage due to positioning - that if epidural is used for C-section that there is a chance of epidural failure requiring spinal placement or conversion to GA - Damage to heart, brain, lungs, other parts of body or loss of life  Patient voiced understanding.)       Anesthesia Quick Evaluation

## 2022-10-18 NOTE — H&P (Signed)
Cristina Allen is a 37 y.o. female presenting for SROM and contractions..She reports feeling a release of fluid at approximately 0200 this morning. Her mother is with her and is supportive. She began contracting around the time of her SROM.She is  a Gravida 4 Para 0 and  is 38 weeks 6 days gestation.She had indicated a desire for waterbirth, but has not brought the tub or other equipment. OB History     Gravida  4   Para      Term      Preterm      AB  3   Living  0      SAB  2   IAB  1   Ectopic      Multiple      Live Births             Past Medical History:  Diagnosis Date   Anxiety and depression    Fibroids    GERD (gastroesophageal reflux disease)    Psoriasis of scalp    Sickle cell trait ([facility])    Past Surgical History:  Procedure Laterality Date   DILATION AND CURETTAGE OF UTERUS  2006   TONSILLECTOMY     Family History: family history includes Arthritis in her sister; Asthma in an other family member; Birth defects in an other family member; COPD in her paternal grandmother; Cancer in an other family member; Diabetes in her maternal grandfather; Healthy in her father and mother; Hypertension in her paternal grandmother. Social History:  reports that she has never smoked. She has never used smokeless tobacco. She reports that she does not currently use alcohol. She reports that she does not use drugs.     Maternal Diabetes: No Genetic Screening: Normal Maternal Ultrasounds/Referrals: Normal Fetal Ultrasounds or other Referrals:  Referred to Materal Fetal Medicine  Maternal Substance Abuse:  No Significant Maternal Medications:  Meds include: Other:  Lop[place]er Significant Maternal Lab Results:  Group B Strep positive Number of Prenatal Visits:greater than 3 verified prenatal visits Other Comments:   she has a fibroid that measures 6.3 x 6.4 cms  She was seen by cardiology for palpitations this pregnancy and takes Lopressor. She has also received  numerous iron infusions for anemia this pregnancy as well.  Review of Systems History Dilation: 2.5 Effacement (%): 80 Station: -2 Exam by:: Gigi Gin CNM There were no vitals taken for this visit. Maternal Exam:  Uterine Assessment: Contraction strength is mild.  Contraction frequency is irregular.  Abdomen: Fetal presentation: vertex Introitus: Normal vulva. Normal vagina.  Amniotic fluid character: clear. Pelvis: adequate for delivery.   Cervix: Cervix evaluated by digital exam.     Physical Exam Constitutional:      Appearance: Normal appearance.  HENT:     Head: Normocephalic and atraumatic.  Cardiovascular:     Rate and Rhythm: Normal rate and regular rhythm.     Pulses: Normal pulses.     Heart sounds: Normal heart sounds.  Pulmonary:     Effort: Pulmonary effort is normal.     Breath sounds: Normal breath sounds.  Abdominal:     Comments: Gravid  Genitourinary:    General: Normal vulva.     Rectum: Normal.     Comments: Scant bloody show. SROM is noted. Musculoskeletal:        General: Normal range of motion.     Cervical back: Normal range of motion and neck supple.  Skin:    General: Skin is warm and dry.  Neurological:     General: No focal deficit present.     Mental Status: She is alert and oriented to person, place, and time.  Psychiatric:        Mood and Affect: Mood normal.        Behavior: Behavior normal.     Prenatal labs: ABO, Rh: O/Positive/-- (07/18 1453) Antibody: Negative (07/18 1453) Rubella: 10.50 (07/18 1453) RPR: Non Reactive (11/07 1051)  HBsAg: Negative (07/18 1453)  HIV: Non Reactive (11/07 1051)  GBS:     Assessment/Plan: 37 yo G4 P 0 at 38 weeks 6 days gestation, admitted with SROM since 0200 and mild contractions. Iron deficiency anemia this pregnancy GBS positive- will treat with PCN. Routine admission orders and labs. Continuous EFM We discussed pain control options- and she is requesting IV pain medis. Stadol 1  mg IV push now. Epidural and Nitrous oxide also described as choices.  Dr. Marcelline Mates notified via text of her admission.   Imagene Riches 10/18/2022, 5:30 AM

## 2022-10-19 ENCOUNTER — Encounter: Payer: Self-pay | Admitting: Obstetrics and Gynecology

## 2022-10-19 DIAGNOSIS — Z3A39 39 weeks gestation of pregnancy: Secondary | ICD-10-CM

## 2022-10-19 DIAGNOSIS — O4202 Full-term premature rupture of membranes, onset of labor within 24 hours of rupture: Secondary | ICD-10-CM

## 2022-10-19 DIAGNOSIS — D509 Iron deficiency anemia, unspecified: Secondary | ICD-10-CM

## 2022-10-19 DIAGNOSIS — O99820 Streptococcus B carrier state complicating pregnancy: Secondary | ICD-10-CM

## 2022-10-19 DIAGNOSIS — O9902 Anemia complicating childbirth: Principal | ICD-10-CM

## 2022-10-19 LAB — CBC
HCT: 29.2 % — ABNORMAL LOW (ref 36.0–46.0)
Hemoglobin: 9 g/dL — ABNORMAL LOW (ref 12.0–15.0)
MCH: 22.2 pg — ABNORMAL LOW (ref 26.0–34.0)
MCHC: 30.8 g/dL (ref 30.0–36.0)
MCV: 72.1 fL — ABNORMAL LOW (ref 80.0–100.0)
Platelets: 208 10*3/uL (ref 150–400)
RBC: 4.05 MIL/uL (ref 3.87–5.11)
RDW: 29.6 % — ABNORMAL HIGH (ref 11.5–15.5)
WBC: 16.3 10*3/uL — ABNORMAL HIGH (ref 4.0–10.5)
nRBC: 0.2 % (ref 0.0–0.2)

## 2022-10-19 MED ORDER — ONDANSETRON HCL 4 MG PO TABS: 4.0000 mg | ORAL_TABLET | ORAL | Status: DC | PRN

## 2022-10-19 MED ORDER — ACETAMINOPHEN 325 MG PO TABS: 650.0000 mg | ORAL_TABLET | ORAL | Status: DC | PRN

## 2022-10-19 MED ORDER — IBUPROFEN 600 MG PO TABS
600.0000 mg | ORAL_TABLET | Freq: Four times a day (QID) | ORAL | Status: DC
  Administered 2022-10-19 – 2022-10-20 (×6): 600 mg via ORAL
  Filled 2022-10-19 (×6): qty 1

## 2022-10-19 MED ORDER — OXYCODONE-ACETAMINOPHEN 5-325 MG PO TABS: 1.0000 | ORAL_TABLET | ORAL | Status: DC | PRN

## 2022-10-19 MED ORDER — WITCH HAZEL-GLYCERIN EX PADS: 1.0000 | MEDICATED_PAD | CUTANEOUS | Status: DC | PRN

## 2022-10-19 MED ORDER — ZOLPIDEM TARTRATE 5 MG PO TABS: 5.0000 mg | ORAL_TABLET | Freq: Every evening | ORAL | Status: DC | PRN

## 2022-10-19 MED ORDER — ONDANSETRON HCL 4 MG/2ML IJ SOLN: 4.0000 mg | INTRAMUSCULAR | Status: DC | PRN

## 2022-10-19 MED ORDER — COCONUT OIL OIL: 1.0000 | TOPICAL_OIL | Status: DC | PRN

## 2022-10-19 MED ORDER — TETANUS-DIPHTH-ACELL PERTUSSIS 5-2.5-18.5 LF-MCG/0.5 IM SUSY: 0.5000 mL | PREFILLED_SYRINGE | Freq: Once | INTRAMUSCULAR | Status: DC

## 2022-10-19 MED ORDER — PRENATAL MULTIVITAMIN CH
1.0000 | ORAL_TABLET | Freq: Every day | ORAL | Status: DC
  Administered 2022-10-19 – 2022-10-20 (×2): 1 via ORAL
  Filled 2022-10-19 (×2): qty 1

## 2022-10-19 MED ORDER — DIBUCAINE (PERIANAL) 1 % EX OINT: 1.0000 | TOPICAL_OINTMENT | CUTANEOUS | Status: DC | PRN

## 2022-10-19 MED ORDER — BENZOCAINE-MENTHOL 20-0.5 % EX AERO
1.0000 | INHALATION_SPRAY | CUTANEOUS | Status: DC | PRN
  Filled 2022-10-19: qty 56

## 2022-10-19 MED ORDER — SIMETHICONE 80 MG PO CHEW: 80.0000 mg | CHEWABLE_TABLET | ORAL | Status: DC | PRN

## 2022-10-19 MED ORDER — SENNOSIDES-DOCUSATE SODIUM 8.6-50 MG PO TABS
2.0000 | ORAL_TABLET | Freq: Every day | ORAL | Status: DC
  Administered 2022-10-20: 2 via ORAL
  Filled 2022-10-19: qty 2

## 2022-10-19 MED ORDER — DIPHENHYDRAMINE HCL 25 MG PO CAPS: 25.0000 mg | ORAL_CAPSULE | Freq: Four times a day (QID) | ORAL | Status: DC | PRN

## 2022-10-19 MED ORDER — OXYCODONE-ACETAMINOPHEN 5-325 MG PO TABS: 2.0000 | ORAL_TABLET | ORAL | Status: DC | PRN

## 2022-10-19 NOTE — Anesthesia Postprocedure Evaluation (Signed)
Anesthesia Post Note  Patient: Cristina Allen  Procedure(s) Performed: AN AD [place]  Patient location during evaluation: Mother Baby Anesthesia Type: Epidural Level of consciousness: awake and alert Pain management: pain level controlled Vital Signs Assessment: post-procedure vital signs reviewed and stable Respiratory status: spontaneous breathing, nonlabored ventilation and respiratory function stable Cardiovascular status: stable Postop Assessment: no headache, no backache, patient able to bend at knees and able to ambulate Anesthetic complications: no   No notable events documented.   Last Vitals:  Vitals:   10/19/22 0520 10/19/22 0750  BP: 138/89 109/81  Pulse: 96 92  Resp: 18 17  Temp: 36.7 C 36.9 C  SpO2: 100% 98%    Last Pain:  Vitals:   10/19/22 0918  TempSrc:   PainSc: 0-No pain                 Darrin Nipper

## 2022-10-19 NOTE — Lactation Note (Signed)
This note was copied from a baby's chart. Lactation Consultation Note  Patient Name: Girl Addilynn Mowrer [id number] Date: 10/19/2022 Reason for consult: Initial assessment;Primapara;Term Age:37 hours  Maternal Data Has patient been taught Hand Expression?: Yes Does the patient have breastfeeding experience prior to this delivery?: No  Feeding Mother's Current Feeding Choice: Breast Milk Mom had attempted breast at 1000, baby sleepy, no feeding since 0500, baby stimulated awake and diaper changed, baby latched easily to right breast in cradle hold after hand expressing drops of colostrum, a few swallows noted, continues to nurse well at 15 min.  LATCH Score Latch: Grasps breast easily, tongue down, lips flanged, rhythmical sucking.  Audible Swallowing: A few with stimulation  Type of Nipple: Everted at rest and after stimulation  Comfort (Breast/Nipple): Soft / non-tender  Hold (Positioning): Assistance needed to correctly position infant at breast and maintain latch.  LATCH Score: 8   Lactation Tools Discussed/Used  LC name and no written on white board for questions or assistance   Interventions Interventions: Breast feeding basics reviewed;Assisted with latch;Skin to skin;Hand express;Adjust position;Support pillows;Position options;Education (purelan given with instruction in use)  Discharge Pump: Personal  Consult Status Consult Status: PRN    Ferol Luz 10/19/2022, 12:09 PM

## 2022-10-19 NOTE — Discharge Summary (Addendum)
OB Discharge Summary     Patient Name: Cristina Allen DOB: December 26, 1985 MRN: [id number]  Date of admission: 10/18/2022 Delivering MD: Philip Aspen, CNM  Date of Delivery: 10/20/2022  Date of discharge: 10/20/2022  Admitting diagnosis: Uterine contractions [O47.9] Intrauterine pregnancy: [redacted]w[redacted]d    Secondary diagnosis: Anemia and , SROM, fibroid, GBS positive, intermittent palpitations.     Discharge diagnosis: Term Pregnancy Delivered                                                                                                Post partum procedures: none  Augmentation: Pitocin  Complications: None  Hospital course:  Onset of Labor With Vaginal Delivery      37y.o. yo G4P0030 at 352w0das admitted in early labor on 10/18/2022. Labor course was complicated by GBS pos with adequate prophylaxis Received augmentation with pitocin. Membrane Rupture Time/Date:  ,10/18/22 at 0200 Delivery Method:Vaginal, Spontaneous  Episiotomy:  none Lacerations:   left periurethral and left sulcus, hemostatic, did not require repair Patient had a postpartum course complicated by n/a.  She is ambulating, tolerating a regular diet, passing flatus, and urinating well. Patient is discharged home in stable condition on 10/20/22.  Newborn Data: Birth date:10/19/2022  Birth time:1:02 AM  Gender:Female  Living status:Living  Apgars:9 ,9  Weight:3080 g   Subjective:   Physical exam  Vitals:   10/19/22 1547 10/19/22 1939 10/19/22 2255 10/20/22 0819  BP: 118/76 (!) 130/92 118/89 110/80  Pulse: 99 100 95 98  Resp: '18 18 20 18  '$ Temp: 98.4 F (36.9 C) 98.4 F (36.9 C) 98 F (36.7 C) 98.2 F (36.8 C)  TempSrc: Oral Oral Oral Oral  SpO2:  97% 99% 100%  Weight:      Height:       General: alert, cooperative, and no distress Breast: soft, non-tender, nipples without breakdown Lochia: appropriate Uterine Fundus: firm Perineum: no erythema or foul odor discharge, minimal edema DVT  Evaluation: No evidence of DVT seen on physical exam. No cords or calf tenderness. No significant calf/ankle edema.  Labs: Lab Results  Component Value Date   WBC 16.3 (H) 10/19/2022   HGB 9.0 (L) 10/19/2022   HCT 29.2 (L) 10/19/2022   MCV 72.1 (L) 10/19/2022   PLT 208 10/19/2022    Discharge instruction: in After Visit Summary.  Medications:  Allergies as of 10/20/2022   No Known Allergies      Medication List     TAKE these medications    Adderall XR 25 MG 24 hr capsule Generic drug: amphetamine-dextroamphetamine Take 1 capsule by mouth as needed.   Butalbital-APAP-Caffeine 50-325-40 MG capsule Take 1-2 capsules by mouth every 6 (six) hours as needed for headache.   cyclobenzaprine 5 MG tablet Commonly known as: FLEXERIL Take 1 tablet (5 mg total) by mouth 3 (three) times daily as needed for muscle spasms.   metoprolol tartrate 25 MG tablet Commonly known as: LOPRESSOR Take 0.5 tablets (12.5 mg total) by mouth 2 (two) times daily as needed (PALPITATIONS).   Vitamin D (Ergocalciferol) 1.25 MG (50000 UNIT) Caps capsule Commonly  known as: DRISDOL Take 50,000 Units by mouth once a week.   VitaPearl 30-1.4-200 MG Cpcr Take 1 capsule by mouth daily.         Activity: Advance as tolerated. Pelvic rest for 6 weeks.   Outpatient follow up:  Follow-up [facility] at [facility] Follow up.   Specialty: Obstetrics and Gynecology Why: 2 week telehealth and 6 week in person visit Contact information: [address] [place] [address] [phone]                  Postpartum contraception: Undecided Rhogam Given postpartum: NA Rubella vaccine given postpartum: not indicated Varicella vaccine given postpartum: not indicated TDaP given antepartum or postpartum: given 08/06/22  Newborn Data: Live born female  Birth Weight:   APGAR: 106, 9  Newborn Delivery   Birth date/time: 10/19/2022  01:02:00 Delivery type: Vaginal, Spontaneous       Baby Feeding: Breast  Disposition:home with mother   Philip Aspen, CNM  10/20/2022 8:45 AM

## 2022-10-20 ENCOUNTER — Encounter: Payer: Self-pay | Admitting: Obstetrics and Gynecology

## 2022-10-20 NOTE — Discharge Summary (Signed)
Postpartum Discharge Summary  Date of Service updated: 10/20/22     Patient Name: Cristina Allen DOB: Mar 13, 1986 MRN: [id number]  Date of admission: 10/18/2022 Delivery date:10/19/2022  Delivering provider: Maggie Font  Date of discharge: 10/20/2022  Admitting diagnosis: Uterine contractions [O47.9] Intrauterine pregnancy: [redacted]w[redacted]d    Secondary diagnosis:  Principal Problem:   Uterine contractions Active Problems:   Postpartum care and examination of lactating mother  Additional problems: fibroid uterus , anemia     Discharge diagnosis: Term Pregnancy Delivered                                              Post partum procedures: none Augmentation: Pitocin Complications: None  Hospital course: Onset of Labor With Vaginal Delivery      37y.o. yo [id number] 349w0das admitted in Latent Labor on 10/18/2022. Labor course was uncomplicated.   Membrane Rupture Time/Date:  ,   Delivery Method:Vaginal, Spontaneous  Episiotomy:   none Lacerations:   left periurethral and left sulcus, hemostatic and did not require repair  Patient had a postpartum course uncomplicated.  She is ambulating, tolerating a regular diet, passing flatus, and urinating well. Patient is discharged home in stable condition on 10/20/22.  Newborn [place] periurethral and left sulcus, hemostatic and did not require repair : Birth date:10/19/2022  Birth time:1:02 AM  Gender:Female  Living status:Living  Apgars:9 ,9  Weight:3080 g   Magnesium Sulfate received: No BMZ received: No Rhophylac:No MMR:No T-DaP:Given prenatally Flu: No Transfusion:No  Physical exam  Vitals:   10/19/22 1547 10/19/22 1939 10/19/22 2255 10/20/22 0819  BP: 118/76 (!) 130/92 118/89 110/80  Pulse: 99 100 95 98  Resp: '18 18 20 18  '$ Temp: 98.4 F (36.9 C) 98.4 F (36.9 C) 98 F (36.7 C) 98.2 F (36.8 C)  TempSrc: Oral Oral Oral Oral  SpO2:  97% 99% 100%  Weight:      Height:       General: alert, cooperative, and no  distress Lochia: appropriate Uterine Fundus: firm Incision: N/A DVT Evaluation: No evidence of DVT seen on physical exam. No cords or calf tenderness. No significant calf/ankle edema. Labs: Lab Results  Component Value Date   WBC 16.3 (H) 10/19/2022   HGB 9.0 (L) 10/19/2022   HCT 29.2 (L) 10/19/2022   MCV 72.1 (L) 10/19/2022   PLT 208 10/19/2022      Latest Ref Rng & Units 04/03/2022    6:22 PM  CMP  Glucose 70 - 99 mg/dL 118   BUN 6 - 20 mg/dL 7   Creatinine 0.44 - 1.00 mg/dL 0.68   Sodium 135 - 145 mmol/L 136   Potassium 3.5 - 5.1 mmol/L 3.7   Chloride 98 - 111 mmol/L 107   CO2 22 - 32 mmol/L 21   Calcium 8.9 - 10.3 mg/dL 8.5    Edinburgh Score:    10/19/2022    4:20 AM  Edinburgh Postnatal Depression Scale Screening Tool  I have been able to laugh and see the funny side of things. 1  I have looked forward with enjoyment to things. 2  I have blamed myself unnecessarily when things went wrong. 2  I have been anxious or worried for no good reason. 1  I have felt scared or panicky for no good reason. 2  Things have been getting on top of me.  2  I have been so unhappy that I have had difficulty sleeping. 2  I have felt sad or miserable. 2  I have been so unhappy that I have been crying. 1  The thought of harming myself has occurred to me. 0  Edinburgh Postnatal Depression Scale Total 15      After visit meds:  Allergies as of 10/20/2022   No Known Allergies      Medication List     TAKE these medications    Adderall XR 25 MG 24 hr capsule Generic drug: amphetamine-dextroamphetamine Take 1 capsule by mouth as needed.   Butalbital-APAP-Caffeine 50-325-40 MG capsule Take 1-2 capsules by mouth every 6 (six) hours as needed for headache.   cyclobenzaprine 5 MG tablet Commonly known as: FLEXERIL Take 1 tablet (5 mg total) by mouth 3 (three) times daily as needed for muscle spasms.   metoprolol tartrate 25 MG tablet Commonly known as: LOPRESSOR Take 0.5  tablets (12.5 mg total) by mouth 2 (two) times daily as needed (PALPITATIONS).   Vitamin D (Ergocalciferol) 1.25 MG (50000 UNIT) Caps capsule Commonly known as: DRISDOL Take 50,000 Units by mouth once a week.   VitaPearl 30-1.4-200 MG Cpcr Take 1 capsule by mouth daily.         Discharge home in stable condition Infant Feeding: Breast Infant Disposition:home with mother Discharge instruction: per After Visit Summary and Postpartum booklet. Activity: Advance as tolerated. Pelvic rest for 6 weeks.  Diet: routine diet Anticipated Birth Control: Unsure Postpartum Appointment:6 weeks Additional Postpartum F/U:  2 wk video visit  Future Appointments: Future Appointments  Date Time Provider B[place]  10/23/2022  1:15 PM Philip Aspen, CNM [facility] None  12/02/2022  1:30 PM [facility] None  12/03/2022  1:45 PM Earlie Server, MD [facility] None  12/03/2022  2:15 PM [facility] INFUSION CHAIR 12 [facility] None   Follow up Visit:  [facility] at [facility] Follow up.   Specialty: Obstetrics and Gynecology Why: 2 week telehealth and 6 week in person visit Contact information: [address] [place] [address] [phone]                    10/20/2022 Philip Aspen, CNM

## 2022-10-20 NOTE — Progress Notes (Signed)
Pt discharged with infant.  Discharge instructions, prescriptions and follow up appointment given to and reviewed with pt. Pt verbalized understanding. Escorted out by auxillary. 

## 2022-10-20 NOTE — Final Progress Note (Signed)
Physician Final Progress Note  Patient ID: Cristina Allen MRN: [id number] DOB/AGE: [birth year] 37 y.o.  Admit date: 10/18/2022 Admitting provider: Rubie Maid, MD Discharge date: 10/20/2022   Admission Diagnoses: SROM , latent labor   Discharge Diagnoses:  Principal Problem:   Uterine contractions Active Problems:   Postpartum care and examination of lactating mother    Consults: None  Significant Findings/ Diagnostic Studies: GBS positive in urine  Procedures: none  Discharge Condition: good  Disposition: Discharge disposition: 01-Home or Self Care       Diet: Regular diet  Discharge Activity: No lifting, driving, or strenuous exercise for 4 weeks   Allergies as of 10/20/2022   No Known Allergies      Medication List     TAKE these medications    Adderall XR 25 MG 24 hr capsule Generic drug: amphetamine-dextroamphetamine Take 1 capsule by mouth as needed.   Butalbital-APAP-Caffeine 50-325-40 MG capsule Take 1-2 capsules by mouth every 6 (six) hours as needed for headache.   cyclobenzaprine 5 MG tablet Commonly known as: FLEXERIL Take 1 tablet (5 mg total) by mouth 3 (three) times daily as needed for muscle spasms.   metoprolol tartrate 25 MG tablet Commonly known as: LOPRESSOR Take 0.5 tablets (12.5 mg total) by mouth 2 (two) times daily as needed (PALPITATIONS).   Vitamin D (Ergocalciferol) 1.25 MG (50000 UNIT) Caps capsule Commonly known as: DRISDOL Take 50,000 Units by mouth once a week.   VitaPearl 30-1.4-200 MG Cpcr Take 1 capsule by mouth daily.        Follow-up [facility] at [facility] Follow up.   Specialty: Obstetrics and Gynecology Why: 2 week telehealth and 6 week in person visit Contact information: [address] [place] [address] [phone]                Total time spent taking care of this patient: 10 minutes  Signed: Philip Aspen 10/20/2022, 8:45 AM

## 2022-10-20 NOTE — Lactation Note (Signed)
This note was copied from a baby's chart. Lactation Consultation Note  Patient Name: Cristina Allen Date: 10/20/2022 Reason for consult: Follow-up assessment;Primapara;Term Age:37 hours  Maternal Data Has patient been taught Hand Expression?: Yes Does the patient have breastfeeding experience prior to this delivery?: No  P1, SVD 32 hours ago. Hx of Anxiety/Depression, GERD, Sickle Cell Trait. Mom has been BF since delivery. Voices no concerns.  Feeding Mother's Current Feeding Choice: Breast Milk  Baby seems to be breastfeeding well, voiding and stooling, passed 24hr screens.  Observed feeding at bedside. Good position for cradle, initially baby appeared shallow on the nipple, assisted with pulling down on chin while mom brought her in deeper. Audible swallows. Baby continuing to feed well past 12 minutes.  LATCH Score Latch: Grasps breast easily, tongue down, lips flanged, rhythmical sucking.  Audible Swallowing: Spontaneous and intermittent  Type of Nipple: Everted at rest and after stimulation  Comfort (Breast/Nipple): Soft / non-tender  Hold (Positioning): Assistance needed to correctly position infant at breast and maintain latch. (Pull down on chin to deepen latch)  LATCH Score: 9   Lactation Tools Discussed/Used    Interventions Interventions: Assisted with latch;Support pillows;Education;Pre-pump if needed  Education given for 8-12 feedings per 24 hours, reviewed early cues, feeding on demand, normal course of lactation, milk supply and demand, and output expectations. Encouraged to hand express to encourage wide mouth, tips for widening mouth and keeping baby awake/alert while feeding.  Discharge Discharge Education: Engorgement and breast care;Warning signs for feeding baby;Outpatient recommendation Pump: Personal  Reviewed anticipated breast changes and management of breast fullness/engorgement and nipple care. Outpatient lactation number  provided. Encouraged to call with questions and ongoing support as needed.  Consult Status Consult Status: Complete    [name] 10/20/2022, 9:41 AM

## 2022-10-20 NOTE — Progress Notes (Addendum)
CSW received consult due to score of 15 on Edinburgh Depression Screen.  CSW met with patient at bedside to discuss [place] Depression Screening results and to offer resources if needed.  Patient's boyfriend at bedside and was give permission to remain in the room by patient during consult.  Patient states  mood has improved since giving birth and getting some rest. Patient confirms that she has a positive support system and will be residing with her mother post discharge from the hospital. Patient reports having all items needed to take care of newborn with plans to follow up with [facility]. Car seat visible in the room. Patient encouraged to evaluate her mental health throughout the postpartum period and to notify a medical professional if symptoms arise. Patient will continue to follow up with [facility].

## 2022-10-21 ENCOUNTER — Encounter: Payer: Self-pay | Admitting: Oncology

## 2022-10-22 ENCOUNTER — Encounter: Payer: Self-pay | Admitting: Oncology

## 2022-10-22 ENCOUNTER — Encounter: Payer: Self-pay | Admitting: Certified Nurse Midwife

## 2022-10-23 ENCOUNTER — Encounter: Payer: No Typology Code available for payment source | Admitting: Certified Nurse Midwife

## 2022-10-28 ENCOUNTER — Telehealth: Payer: Self-pay

## 2022-10-28 NOTE — Telephone Encounter (Signed)
Pt calling; states she left a msg 2wks ago for [employer] about her FMLA; [place] hasn't received them.  She delivered on the 20th.  Does she need to resubmit the paperwork and pay again?  [phone]

## 2022-10-29 NOTE — Telephone Encounter (Signed)
Left detailed msg the we haven't recv'd FMLA since 04/2022; if these are for the same leave she would not have to pay the fee; to please have them faxed or bring them in.

## 2022-10-29 NOTE — Telephone Encounter (Signed)
I contacted patient is scheduled 2/1 for nurse BP check at 3:15 pm

## 2022-10-31 ENCOUNTER — Ambulatory Visit: Payer: No Typology Code available for payment source

## 2022-10-31 VITALS — BP 121/90 | HR 92 | Ht 61.0 in | Wt 138.0 lb

## 2022-10-31 DIAGNOSIS — Z0130 Encounter for examination of blood pressure without abnormal findings: Secondary | ICD-10-CM

## 2022-10-31 NOTE — Progress Notes (Signed)
    NURSE VISIT NOTE  Subjective:    Patient ID: Cristina Allen, female    DOB: 1986-03-06, 37 y.o.   MRN: [id number]  HPI  Patient is a 37 y.o. [phone] female who presents for BP check per order from Philip Aspen, CNM.  At home they have been 131-138 top  bottom 93-97  Patient reports compliance with prescribed BP medications: yes nifedipine 30 mls by [facility] daily Last dose of BP medication:  10/30/2022  BP Readings from Last 3 Encounters:  10/31/22 (!) 121/90  10/20/22 110/80  10/16/22 131/87   Pulse Readings from Last 3 Encounters:  10/31/22 92  10/20/22 98  10/16/22 (!) 128    Objective:    BP (!) 121/90   Pulse 92   Ht '5\' 1"'$  (1.549 m)   Wt 138 lb (62.6 kg)   LMP  (LMP Unknown)   Breastfeeding Yes   BMI 26.07 kg/m   Assessment:   1. Blood pressure check      Plan:   Per Alma Friendly Dominic CNM :  Continue to monitor blood pressure at home. Keep upcoming apt  Patient verbalized understanding of instructions.   Landis Gandy, [place]

## 2022-11-01 ENCOUNTER — Telehealth: Payer: Self-pay

## 2022-11-01 NOTE — Telephone Encounter (Signed)
FMLA/DISABILTY form for [employer] filled out and faxed.  Copy for pt is up front.

## 2022-11-06 NOTE — Telephone Encounter (Signed)
Pt aware FMLA/DISABILITY form for [employer] filled out, faxed, and copy made for pt and placed in drawer up front.

## 2022-11-12 ENCOUNTER — Encounter: Payer: Self-pay | Admitting: Oncology

## 2022-11-13 ENCOUNTER — Telehealth: Payer: No Typology Code available for payment source | Admitting: Obstetrics

## 2022-11-13 ENCOUNTER — Encounter: Payer: Self-pay | Admitting: Obstetrics

## 2022-11-13 NOTE — Progress Notes (Signed)
Postpartum Visit  Chief Complaint:  Chief Complaint  Patient presents with   Postpartum Care    2 week f/u    History of Present Illness: Patient is a 37 y.o. [id number] presents for postpartum visit.    Routine Prenatal Care Visit- Virtual Visit  Subjective   Virtual Visit via Telephone Note  I connected withNAME@ on 11/13/22 at 10:55 AM EST by telephone and verified that I am speaking with the correct person using two identifiers.   I discussed the limitations, risks, security and privacy concerns of performing an evaluation and management service by telephone and the availability of in person appointments. I also discussed with the patient that there may be a patient responsible charge related to this service. The patient expressed understanding and agreed to proceed.  The patient was at home I spoke with the patient from my  office The names of people involved in this encounter were: Nanda Quinton and Gigi Gin CNM  -------------------------------------------------------------------------- ----------------------------------------------------------------------------------- The following portions of the patient's history were reviewed and updated as appropriate: allergies, current medications, past family history, past medical history, past social history, past surgical history and problem list. Problem list updated.   Objective  currently breastfeeding. Pregravid weight No episode found Total Weight Gain Not found. She denies any depressive symptoms. Denies any headaches, visual changes, swelling Some perineal pain Feels constipated- using colace    Assessment   37 y.o. [id number] at Unknown by  Not found. presenting for a virtual 2 week PP visit at 4 wks past delivery. Doing well Breastfeeding. Continuing on daily Procardia constipation Plan   Problem List     No episode was linked to this visit.        Gestational age appropriate obstetric precautions  including but not limited to vaginal bleeding, contractions, leaking of fluid and fetal movement were reviewed in detail with the patient.     Follow Up Instructions:    I discussed the assessment and treatment plan with the patient. The patient was provided an opportunity to ask questions and all were answered. The patient agreed with the plan and demonstrated an understanding of the instructions.   The patient was advised to call back or seek an in-person evaluation if the symptoms worsen or if the condition fails to improve as anticipated.  I provided 20 minutes of non-face-to-face time during this encounter.  Return in about 4 weeks (around 12/11/2022) for 6 wk Postpartum physical.  Imagene Riches, CNM  11/13/2022 12:25 PM   [facility] 11/13/2022 12:06 PM    Date of delivery: 10/19/2022 Type of delivery: Vaginal delivery - Vacuum or forceps assisted  no Episiotomy No.  Laceration: sulcus tear and small perineal- not repaired.  Pregnancy or labor problems:  yes Hypertension Any problems since the delivery:  Continues on Procardia. Take Adderall only sparingly , as needed for ADD. Has not used the Fioricet.  Newborn Details:  SINGLETON :  1. 73 name: female. Birth weight: 3080 gms Maternal Details:  Breast Feeding:  yes Post partum depression/anxiety noted:  no Edinburgh Post-Partum Depression Score:  not done   Past Medical History:  Diagnosis Date   Anxiety and depression    Fibroids    GERD (gastroesophageal reflux disease)    Psoriasis of scalp    Sickle cell trait ([place])     Past Surgical History:  Procedure Laterality Date   DILATION AND CURETTAGE OF UTERUS  2006   TONSILLECTOMY  Prior to Admission medications   Medication Sig Start Date End Date Taking? Authorizing Provider  ADDERALL XR 25 MG 24 hr capsule Take 1 capsule by mouth as needed. 03/27/22  Yes [provider]  Butalbital-APAP-Caffeine 50-325-40  MG capsule Take 1-2 capsules by mouth every 6 (six) hours as needed for headache. 07/17/22  Yes Dominic, Nunzio Cobbs, CNM  cyclobenzaprine (FLEXERIL) 5 MG tablet Take 1 tablet (5 mg total) by mouth 3 (three) times daily as needed for muscle spasms. 09/02/22  Yes Dominic, Nunzio Cobbs, CNM  metoprolol tartrate (LOPRESSOR) 25 MG tablet Take 0.5 tablets (12.5 mg total) by mouth 2 (two) times daily as needed (PALPITATIONS). 10/11/22  Yes Freada Bergeron, MD  NIFEdipine (PROCARDIA-XL/NIFEDICAL-XL) 30 MG 24 hr tablet Take 1 tablet by mouth daily. 10/24/22 11/23/22 Yes [provider]  Prenat-FeFum-Fered-FA-DHA w/oA (VITAPEARL) 30-1.4-200 MG CPCR Take 1 capsule by mouth daily. 04/24/22  Yes Rod Can, CNM  Vitamin D, Ergocalciferol, (DRISDOL) 1.25 MG (50000 UNIT) CAPS capsule Take 50,000 Units by mouth once a week. 03/22/22  Yes [provider]    No Known Allergies   Social History   Socioeconomic History   Marital status: Significant Other    Spouse name: Not on file   Number of children: 0   Years of education: 15   Highest education level: Not on file  Occupational History   Occupation: Marketing executive for [employer]  Tobacco Use   Smoking status: Never   Smokeless tobacco: Never  Vaping Use   Vaping Use: Never used  Substance and Sexual Activity   Alcohol use: Not Currently    Comment: occasional   Drug use: No   Sexual activity: Yes    Birth control/protection: None  Other Topics Concern   Not on file  Social History Narrative   Not on file   Social Determinants of Health   Financial Resource Strain: Not on file  Food Insecurity: No Food Insecurity (10/18/2022)   Hunger Vital Sign    Worried About Running Out of Food in the Last Year: Never true    Ran Out of Food in the Last Year: Never true  Transportation Needs: No Transportation Needs (10/18/2022)   PRAPARE - Hydrologist (Medical): No    Lack of Transportation (Non-Medical):  No  Physical Activity: Sufficiently Active (04/08/2022)   Exercise Vital Sign    Days of Exercise per Week: 3 days    Minutes of Exercise per Session: 60 min  Stress: Stress Concern Present (04/08/2022)   [place]    Feeling of Stress : To some extent  Social Connections: Unknown (04/08/2022)   Social Connection and Isolation Panel [NHANES]    Frequency of Communication with Friends and Family: Three times a week    Frequency of Social Gatherings with Friends and Family: Once a week    Attends Religious Services: More than 4 times per year    Active Member of [employer] or Organizations: No    Attends Archivist Meetings: Never    Marital Status: Not on file  Intimate Partner Violence: Not At Risk (10/18/2022)   Humiliation, Afraid, Rape, and Kick questionnaire    Fear of Current or Ex-Partner: No    Emotionally Abused: No    Physically Abused: No    Sexually Abused: No    Family History  Problem Relation Age of Onset   Healthy Mother    Healthy Father  Arthritis Sister    Diabetes Maternal Grandfather    Hypertension Paternal Grandmother    COPD Paternal Grandmother    Cancer Other    Birth defects Other    Asthma Other    Stroke Neg Hx    Heart disease Neg Hx     Review of Systems  Constitutional: Negative.   HENT: Negative.    Eyes: Negative.   Respiratory: Negative.    Cardiovascular: Negative.   Gastrointestinal: Negative.   Genitourinary: Negative.   Musculoskeletal: Negative.   Skin: Negative.   Neurological: Negative.   Endo/Heme/Allergies: Negative.   Psychiatric/Behavioral:         Denies any depressive symptoms or anxiety   Edinburgh score is 12  Physical Exam  OBGyn Exam - deferred as virtual visit.    Assessment: 37 y.o. [id number] presenting for 2 week postpartum visit done virtually. HTN- continues on the Procardia. Breastfeeding Denies depressive feelings. Undecided re  contraception.  Plan: Problem List Items Addressed This Visit   None Visit Diagnoses     Postpartum care following vaginal delivery    -  Primary        1) Contraception Education given regarding options for contraception, including injectable contraception, IUD placement, oral contraceptives.  3) Patient underwent screening for postpartum depression with no concerns noted.  4) Follow up in 2 weeks for her PP physical and contraception. Imagene Riches, CNM  11/13/2022 12:30 PM   11/13/2022 12:05 PM

## 2022-11-18 ENCOUNTER — Encounter: Payer: Self-pay | Admitting: Oncology

## 2022-11-19 ENCOUNTER — Other Ambulatory Visit: Payer: Self-pay | Admitting: Licensed Practical Nurse

## 2022-11-19 DIAGNOSIS — Z3402 Encounter for supervision of normal first pregnancy, second trimester: Secondary | ICD-10-CM

## 2022-11-19 DIAGNOSIS — G43001 Migraine without aura, not intractable, with status migrainosus: Secondary | ICD-10-CM

## 2022-11-26 ENCOUNTER — Encounter: Payer: Self-pay | Admitting: Obstetrics

## 2022-11-26 ENCOUNTER — Ambulatory Visit (INDEPENDENT_AMBULATORY_CARE_PROVIDER_SITE_OTHER): Payer: No Typology Code available for payment source | Admitting: Obstetrics

## 2022-11-26 VITALS — BP 133/97 | HR 72 | Wt 139.1 lb

## 2022-11-26 DIAGNOSIS — F4321 Adjustment disorder with depressed mood: Secondary | ICD-10-CM

## 2022-11-26 DIAGNOSIS — Z3480 Encounter for supervision of other normal pregnancy, unspecified trimester: Secondary | ICD-10-CM

## 2022-11-26 MED ORDER — SERTRALINE HCL 25 MG PO TABS: 25.0000 mg | ORAL_TABLET | Freq: Every day | ORAL | 3 refills | Status: DC

## 2022-11-26 NOTE — Progress Notes (Signed)
Postpartum Visit  Chief Complaint:  Chief Complaint  Patient presents with   Postpartum Care    6 Week pp    History of Present Illness: Patient is a 37 y.o. [id number] presents for postpartum visit.  Date of delivery: 10/19/2022 Type of delivery: Vaginal delivery - Vacuum or forceps assisted  no Episiotomy No.  Laceration: left perurethral tear and left sulcus tear- no repair needed  Pregnancy or labor problems:  no Any problems since the delivery:  some situational depression as she has recently found out her duaghter has sickle cell.  Newborn Details:  SINGLETON :  1. 39 name: girl. Birth weight: 3080gms Maternal Details:  Breast Feeding:  yes Post partum depression/anxiety noted:  yes Edinburgh Post-Partum Depression Score:  12    Past Medical History:  Diagnosis Date   Anxiety and depression    Fibroids    GERD (gastroesophageal reflux disease)    Psoriasis of scalp    Sickle cell trait (Combine)     Past Surgical History:  Procedure Laterality Date   DILATION AND CURETTAGE OF UTERUS  2006   TONSILLECTOMY      Prior to Admission medications   Medication Sig Start Date End Date Taking? Authorizing Provider  ADDERALL XR 25 MG 24 hr capsule Take 1 capsule by mouth as needed. 03/27/22  Yes [provider]  metoprolol tartrate (LOPRESSOR) 25 MG tablet Take 0.5 tablets (12.5 mg total) by mouth 2 (two) times daily as needed (PALPITATIONS). 10/11/22  Yes Freada Bergeron, MD  Prenat-FeFum-Fered-FA-DHA w/oA Felicity Coyer) 30-1.4-200 MG CPCR Take 1 capsule by mouth daily. 04/24/22  Yes Rod Can, CNM  Vitamin D, Ergocalciferol, (DRISDOL) 1.25 MG (50000 UNIT) CAPS capsule Take 50,000 Units by mouth once a week. 03/22/22  Yes [provider]  Butalbital-APAP-Caffeine 50-325-40 MG capsule Take 1-2 capsules by mouth every 6 (six) hours as needed for headache. Patient not taking: Reported on 11/26/2022 07/17/22   Allen Derry, CNM  cyclobenzaprine  (FLEXERIL) 5 MG tablet Take 1 tablet (5 mg total) by mouth 3 (three) times daily as needed for muscle spasms. Patient not taking: Reported on 11/26/2022 09/02/22   Allen Derry, CNM  NIFEdipine (PROCARDIA-XL/NIFEDICAL-XL) 30 MG 24 hr tablet Take 1 tablet by mouth daily. 10/24/22 11/23/22  [provider]    No Known Allergies   Social History   Socioeconomic History   Marital status: Significant Other    Spouse name: Not on file   Number of children: 0   Years of education: 15   Highest education level: Not on file  Occupational History   Occupation: Marketing executive for [employer]  Tobacco Use   Smoking status: Never   Smokeless tobacco: Never  Vaping Use   Vaping Use: Never used  Substance and Sexual Activity   Alcohol use: Not Currently    Comment: occasional   Drug use: No   Sexual activity: Yes    Birth control/protection: None  Other Topics Concern   Not on file  Social History Narrative   Not on file   Social Determinants of Health   Financial Resource Strain: Not on file  Food Insecurity: No Food Insecurity (10/18/2022)   Hunger Vital Sign    Worried About Running Out of Food in the Last Year: Never true    Ran Out of Food in the Last Year: Never true  Transportation Needs: No Transportation Needs (10/18/2022)   PRAPARE - Hydrologist (Medical): No  Lack of Transportation (Non-Medical): No  Physical Activity: Sufficiently Active (04/08/2022)   Exercise Vital Sign    Days of Exercise per Week: 3 days    Minutes of Exercise per Session: 60 min  Stress: Stress Concern Present (04/08/2022)   Section    Feeling of Stress : To some extent  Social Connections: Unknown (04/08/2022)   Social Connection and Isolation Panel [NHANES]    Frequency of Communication with Friends and Family: Three times a week    Frequency of Social Gatherings with Friends and Family: Once  a week    Attends Religious Services: More than 4 times per year    Active Member of [employer] or Organizations: No    Attends Archivist Meetings: Never    Marital Status: Not on file  Intimate Partner Violence: Not At Risk (10/18/2022)   Humiliation, Afraid, Rape, and Kick questionnaire    Fear of Current or Ex-Partner: No    Emotionally Abused: No    Physically Abused: No    Sexually Abused: No    Family History  Problem Relation Age of Onset   Healthy Mother    Healthy Father    Arthritis Sister    Diabetes Maternal Grandfather    Hypertension Paternal Grandmother    COPD Paternal Grandmother    Cancer Other    Birth defects Other    Asthma Other    Stroke Neg Hx    Heart disease Neg Hx     ROS   Physical Exam BP (!) 133/97   Pulse 72   Wt 139 lb 1.6 oz (63.1 kg)   LMP  (LMP Unknown)   Breastfeeding Yes   BMI 26.28 kg/m   OBGyn Exam   Female Chaperone present during breast and/or pelvic exam.  Assessment: 37 y.o. [id number] presenting for 6 week postpartum visit  Plan: Problem List Items Addressed This Visit   None Visit Diagnoses     Postpartum care following vaginal delivery    -  Primary        1) Contraception Education given regarding options for contraception, including injectable contraception.She shares that she has had a period since delivery and unprotected intercourse x 2. UPT is negative today, and she is breastfeeding. Desires Depo.She is going to wait until her next period, or three more weeks and then return for a UPT and Depo.  2)  Pap - ASCCP guidelines and rational discussed.  Patient opts for annual screening interval  3) Patient underwent screening for postpartum depression with some concerns noted. We discussed a trial of zoloft as she is tearful today; has used Xanax in the past. Hx of anxiety noted on her chart. I have started her on zoloft 25 mg q HS. We will revisit this in three months. Side effects of SSRIs reviewed with  her.  4) Follow up in 3 months for a medication /mood visit.  Imagene Riches, CNM  11/26/2022 5:17 PM   11/26/2022 4:03 PM

## 2022-11-27 ENCOUNTER — Other Ambulatory Visit: Payer: Self-pay | Admitting: Obstetrics

## 2022-11-27 ENCOUNTER — Encounter: Payer: Self-pay | Admitting: Oncology

## 2022-11-27 DIAGNOSIS — F4321 Adjustment disorder with depressed mood: Secondary | ICD-10-CM

## 2022-11-27 MED ORDER — SERTRALINE HCL 25 MG PO TABS: 25.0000 mg | ORAL_TABLET | Freq: Every day | ORAL | 3 refills | Status: DC

## 2022-12-02 ENCOUNTER — Inpatient Hospital Stay: Payer: No Typology Code available for payment source | Attending: Oncology

## 2022-12-02 DIAGNOSIS — D509 Iron deficiency anemia, unspecified: Secondary | ICD-10-CM | POA: Insufficient documentation

## 2022-12-02 DIAGNOSIS — D571 Sickle-cell disease without crisis: Secondary | ICD-10-CM | POA: Insufficient documentation

## 2022-12-02 DIAGNOSIS — O99013 Anemia complicating pregnancy, third trimester: Secondary | ICD-10-CM

## 2022-12-02 DIAGNOSIS — D573 Sickle-cell trait: Secondary | ICD-10-CM

## 2022-12-02 LAB — CBC WITH DIFFERENTIAL/PLATELET
Abs Immature Granulocytes: 0 10*3/uL (ref 0.00–0.07)
Basophils Absolute: 0 10*3/uL (ref 0.0–0.1)
Basophils Relative: 0 %
Eosinophils Absolute: 0.1 10*3/uL (ref 0.0–0.5)
Eosinophils Relative: 2 %
HCT: 37.8 % (ref 36.0–46.0)
Hemoglobin: 11.9 g/dL — ABNORMAL LOW (ref 12.0–15.0)
Immature Granulocytes: 0 %
Lymphocytes Relative: 28 %
Lymphs Abs: 1.3 10*3/uL (ref 0.7–4.0)
MCH: 24.5 pg — ABNORMAL LOW (ref 26.0–34.0)
MCHC: 31.5 g/dL (ref 30.0–36.0)
MCV: 77.8 fL — ABNORMAL LOW (ref 80.0–100.0)
Monocytes Absolute: 0.3 10*3/uL (ref 0.1–1.0)
Monocytes Relative: 7 %
Neutro Abs: 2.8 10*3/uL (ref 1.7–7.7)
Neutrophils Relative %: 63 %
Platelets: 195 10*3/uL (ref 150–400)
RBC: 4.86 MIL/uL (ref 3.87–5.11)
RDW: 22.9 % — ABNORMAL HIGH (ref 11.5–15.5)
WBC: 4.5 10*3/uL (ref 4.0–10.5)
nRBC: 0 % (ref 0.0–0.2)

## 2022-12-02 LAB — IRON AND TIBC
Iron: 93 ug/dL (ref 28–170)
Saturation Ratios: 26 % (ref 10.4–31.8)
TIBC: 354 ug/dL (ref 250–450)
UIBC: 261 ug/dL

## 2022-12-02 LAB — FERRITIN: Ferritin: 47 ng/mL (ref 11–307)

## 2022-12-02 MED FILL — Iron Sucrose Inj 20 MG/ML (Fe Equiv): INTRAVENOUS | Qty: 10 | Status: AC

## 2022-12-03 ENCOUNTER — Inpatient Hospital Stay (HOSPITAL_BASED_OUTPATIENT_CLINIC_OR_DEPARTMENT_OTHER): Payer: No Typology Code available for payment source | Admitting: Oncology

## 2022-12-03 ENCOUNTER — Inpatient Hospital Stay: Payer: No Typology Code available for payment source

## 2022-12-03 ENCOUNTER — Encounter: Payer: Self-pay | Admitting: Oncology

## 2022-12-03 VITALS — BP 126/81 | HR 74 | Temp 98.6°F | Resp 18 | Wt 139.3 lb

## 2022-12-03 DIAGNOSIS — D509 Iron deficiency anemia, unspecified: Secondary | ICD-10-CM | POA: Diagnosis not present

## 2022-12-03 DIAGNOSIS — O99013 Anemia complicating pregnancy, third trimester: Secondary | ICD-10-CM

## 2022-12-03 MED ORDER — IRON-VITAMIN C 65-125 MG PO TABS: 1.0000 | ORAL_TABLET | Freq: Every day | ORAL | 5 refills | Status: DC

## 2022-12-03 NOTE — Progress Notes (Signed)
Pt here for follow up. No new concerns voiced.   

## 2022-12-03 NOTE — Progress Notes (Signed)
Hematology/Oncology Consult note Telephone:([phone]) [phone] Fax:(336) [phone]    CHIEF COMPLAINTS/REASON FOR VISIT:  Iron deficiency anemia  ASSESSMENT & PLAN:   IDA (iron deficiency anemia) Labs are reviewed and discussed with patient. Both hemoglobin and Iron panel have improved.  Hold off IV venofer Recommend patient to take Vitron C daily.   Orders Placed This Encounter  Procedures   CBC with Differential ([place] Only)    Standing Status:   Future    Standing Expiration Date:   12/03/2023   Iron and TIBC    Standing Status:   Future    Standing Expiration Date:   12/03/2023   Ferritin    Standing Status:   Future    Standing Expiration Date:   12/03/2023   Follow up in 6 months.  All questions were answered. The patient knows to call the clinic with any problems, questions or concerns.  Earlie Server, MD, PhD [facility] Hematology Oncology 12/03/2022    HISTORY OF PRESENTING ILLNESS:   Patient is in 3rd trimester pregnancy. Her recent blood work showed hemoglobin of 8.1, mcv 69.  Patient has a history of sickle cell trait.  She reports feeling tired. Menstrual period was heavy before pregnancy.  She denies chest palpitation, chest pain, SOB, blood in the stool, or vaginal bleeding.    INTERVAL HISTORY Odetta Dalley is a 37 y.o. female who has above history reviewed by me today presents for follow up visit for iron deficiency anemia.  She has delivered her baby in January 2024, she is currently breast feeding.  Fatigue has improved.   MEDICAL HISTORY:  Past Medical History:  Diagnosis Date   Anxiety and depression    Fibroids    GERD (gastroesophageal reflux disease)    Psoriasis of scalp    Sickle cell trait (HCC)     SURGICAL HISTORY: Past Surgical History:  Procedure Laterality Date   DILATION AND CURETTAGE OF UTERUS  2006   TONSILLECTOMY      SOCIAL HISTORY: Social History   Socioeconomic History   Marital status: Significant Other     Spouse name: Not on file   Number of children: 0   Years of education: 15   Highest education level: Not on file  Occupational History   Occupation: Marketing executive for [employer]  Tobacco Use   Smoking status: Never   Smokeless tobacco: Never  Vaping Use   Vaping Use: Never used  Substance and Sexual Activity   Alcohol use: Not Currently    Comment: occasional   Drug use: No   Sexual activity: Yes    Birth control/protection: None  Other Topics Concern   Not on file  Social History Narrative   Not on file   Social Determinants of Health   Financial Resource Strain: Not on file  Food Insecurity: No Food Insecurity (10/18/2022)   Hunger Vital Sign    Worried About Running Out of Food in the Last Year: Never true    Ran Out of Food in the Last Year: Never true  Transportation Needs: No Transportation Needs (10/18/2022)   PRAPARE - Hydrologist (Medical): No    Lack of Transportation (Non-Medical): No  Physical Activity: Sufficiently Active (04/08/2022)   Exercise Vital Sign    Days of Exercise per Week: 3 days    Minutes of Exercise per Session: 60 min  Stress: Stress Concern Present (04/08/2022)   [place]  Feeling of Stress : To some extent  Social Connections: Unknown (04/08/2022)   Social Connection and Isolation Panel [NHANES]    Frequency of Communication with Friends and Family: Three times a week    Frequency of Social Gatherings with Friends and Family: Once a week    Attends Religious Services: More than 4 times per year    Active Member of [employer] or Organizations: No    Attends Archivist Meetings: Never    Marital Status: Not on file  Intimate Partner Violence: Not At Risk (10/18/2022)   Humiliation, Afraid, Rape, and Kick questionnaire    Fear of Current or Ex-Partner: No    Emotionally Abused: No    Physically Abused: No    Sexually Abused: No    FAMILY  HISTORY: Family History  Problem Relation Age of Onset   Healthy Mother    Healthy Father    Arthritis Sister    Diabetes Maternal Grandfather    Hypertension Paternal Grandmother    COPD Paternal Grandmother    Cancer Other    Birth defects Other    Asthma Other    Stroke Neg Hx    Heart disease Neg Hx     ALLERGIES:  has No Known Allergies.  MEDICATIONS:  Current Outpatient Medications  Medication Sig Dispense Refill   ADDERALL XR 25 MG 24 hr capsule Take 1 capsule by mouth as needed.     Butalbital-APAP-Caffeine 50-325-40 MG capsule Take 1-2 capsules by mouth every 6 (six) hours as needed for headache. 10 capsule 0   cyclobenzaprine (FLEXERIL) 5 MG tablet Take 1 tablet (5 mg total) by mouth 3 (three) times daily as needed for muscle spasms. 10 tablet 0   Iron-Vitamin C 65-125 MG TABS Take 1 tablet by mouth daily. 30 tablet 5   metoprolol tartrate (LOPRESSOR) 25 MG tablet Take 0.5 tablets (12.5 mg total) by mouth 2 (two) times daily as needed (PALPITATIONS). 30 tablet 3   NIFEdipine (PROCARDIA-XL/NIFEDICAL-XL) 30 MG 24 hr tablet Take 1 tablet by mouth daily.     Prenat-FeFum-Fered-FA-DHA w/oA (VITAPEARL) 30-1.4-200 MG CPCR Take 1 capsule by mouth daily. 30 capsule 9   sertraline (ZOLOFT) 25 MG tablet Take 1 tablet (25 mg total) by mouth daily. 30 tablet 3   Vitamin D, Ergocalciferol, (DRISDOL) 1.25 MG (50000 UNIT) CAPS capsule Take 50,000 Units by mouth once a week.     No current facility-administered medications for this visit.    Review of Systems  Constitutional:  Positive for fatigue. Negative for appetite change, chills and fever.  HENT:   Negative for hearing loss and voice change.   Eyes:  Negative for eye problems.  Respiratory:  Negative for chest tightness and cough.   Cardiovascular:  Negative for chest pain.  Gastrointestinal:  Negative for abdominal distention, abdominal pain and blood in stool.  Endocrine: Negative for hot flashes.  Genitourinary:  Negative  for difficulty urinating and frequency.   Musculoskeletal:  Negative for arthralgias.  Skin:  Negative for rash.  Neurological:  Negative for extremity weakness.  Hematological:  Negative for adenopathy.  Psychiatric/Behavioral:  Negative for confusion.    PHYSICAL EXAMINATION: Vitals:   12/03/22 1401  BP: 126/81  Pulse: 74  Resp: 18  Temp: 98.6 F (37 C)   Filed Weights   12/03/22 1401  Weight: 139 lb 4.8 oz (63.2 kg)    Physical Exam Constitutional:      General: She is not in acute distress. HENT:     Head:  Normocephalic and atraumatic.  Eyes:     General: No scleral icterus. Cardiovascular:     Rate and Rhythm: Normal rate and regular rhythm.     Heart sounds: Normal heart sounds.  Pulmonary:     Effort: Pulmonary effort is normal. No respiratory distress.     Breath sounds: No wheezing.  Abdominal:     Palpations: Abdomen is soft.  Musculoskeletal:        General: No deformity. Normal range of motion.     Cervical back: Normal range of motion and neck supple.  Skin:    General: Skin is warm and dry.     Findings: No erythema or rash.  Neurological:     Mental Status: She is alert and oriented to person, place, and time. Mental status is at baseline.     Cranial Nerves: No cranial nerve deficit.     Coordination: Coordination normal.  Psychiatric:        Mood and Affect: Mood normal.     LABORATORY DATA:  I have reviewed the data as listed    Latest Ref Rng & Units 12/02/2022    1:34 PM 10/19/2022    6:28 AM 10/18/2022    5:15 AM  CBC  WBC 4.0 - 10.5 K/uL 4.5  16.3  10.3   Hemoglobin 12.0 - 15.0 g/dL 11.9  9.0  10.0   Hematocrit 36.0 - 46.0 % 37.8  29.2  32.5   Platelets 150 - 400 K/uL 195  208  217       Latest Ref Rng & Units 04/03/2022    6:22 PM 09/17/2016    5:09 PM  CMP  Glucose 70 - 99 mg/dL 118  110   BUN 6 - 20 mg/dL 7  8   Creatinine 0.44 - 1.00 mg/dL 0.68  0.76   Sodium 135 - 145 mmol/L 136  135   Potassium 3.5 - 5.1 mmol/L 3.7  3.1    Chloride 98 - 111 mmol/L 107  103   CO2 22 - 32 mmol/L 21  24   Calcium 8.9 - 10.3 mg/dL 8.5  8.0   Total Protein 6.5 - 8.1 g/dL  6.9   Total Bilirubin 0.3 - 1.2 mg/dL  0.7   Alkaline Phos 38 - 126 U/L  34   AST 15 - 41 U/L  23   ALT 14 - 54 U/L  13       RADIOGRAPHIC STUDIES: I have personally reviewed the radiological images as listed and agreed with the findings in the report. ECHOCARDIOGRAM COMPLETE  Result Date: 10/16/2022    ECHOCARDIOGRAM REPORT   Patient Name:   Hatsuko Tao Date of Exam: 10/16/2022 Medical Rec #:  [id number]              Height:       61.0 in Accession #:    [id number]             Weight:       160.0 lb Date of Birth:  [birth year]              BSA:          1.718 m Patient Age:    36 years               BP:           128/90 mmHg Patient Gender: F  HR:           116 bpm. Exam Location:  [facility] Procedure: 2D Echo, Cardiac Doppler, Color Doppler, Strain Analysis and 3D Echo Indications:    R00.2 Palpitations  History:        Patient has no prior history of Echocardiogram examinations.                 Signs/Symptoms:Edema, Fatigue, Dizziness/Lightheadedness and                 Shortness of Breath. Cardio-OB echo, Pregnancy- 38 weeks,                 Palpitations, Sickle Cell Trait.  Sonographer:    Deliah Boston RDCS Referring Phys: Freada Bergeron  Sonographer Comments: Cardio-OB echo IMPRESSIONS  1. Left ventricular ejection fraction, by estimation, is 60 to 65%. The left ventricle has normal function. The left ventricle has no regional wall motion abnormalities. Left ventricular diastolic parameters were normal. The average left ventricular global longitudinal strain is -20.8 %. The global longitudinal strain is normal.  2. Right ventricular systolic function is normal. The right ventricular size is normal.  3. The mitral valve is normal in structure. No evidence of mitral valve regurgitation. No evidence of mitral stenosis.  4. The  aortic valve is tricuspid. Aortic valve regurgitation is not visualized. No aortic stenosis is present.  5. The inferior vena cava is normal in size with greater than 50% respiratory variability, suggesting right atrial pressure of 3 mmHg. Comparison(s): No prior Echocardiogram. Conclusion(s)/Recommendation(s): Normal biventricular function without evidence of hemodynamically significant valvular heart disease. FINDINGS  Left Ventricle: Left ventricular ejection fraction, by estimation, is 60 to 65%. The left ventricle has normal function. The left ventricle has no regional wall motion abnormalities. The average left ventricular global longitudinal strain is -20.8 %. The global longitudinal strain is normal. 3D left ventricular ejection fraction analysis performed but not reported based on interpreter judgement due to suboptimal tracking. The left ventricular internal cavity size was normal in size. There is no left ventricular hypertrophy. Left ventricular diastolic parameters were normal. Right Ventricle: The right ventricular size is normal. No increase in right ventricular wall thickness. Right ventricular systolic function is normal. Left Atrium: Left atrial size was normal in size. Right Atrium: Right atrial size was normal in size. Pericardium: There is no evidence of pericardial effusion. Mitral Valve: The mitral valve is normal in structure. No evidence of mitral valve regurgitation. No evidence of mitral valve stenosis. Tricuspid Valve: The tricuspid valve is normal in structure. Tricuspid valve regurgitation is not demonstrated. No evidence of tricuspid stenosis. Aortic Valve: The aortic valve is tricuspid. Aortic valve regurgitation is not visualized. No aortic stenosis is present. Pulmonic Valve: The pulmonic valve was grossly normal. Pulmonic valve regurgitation is trivial. No evidence of pulmonic stenosis. Aorta: The aortic root, ascending aorta, aortic arch and descending aorta are all structurally  normal, with no evidence of dilitation or obstruction. Venous: The inferior vena cava is normal in size with greater than 50% respiratory variability, suggesting right atrial pressure of 3 mmHg. IAS/Shunts: No atrial level shunt detected by color flow Doppler.  LEFT VENTRICLE PLAX 2D LVIDd:         3.50 cm   Diastology LVIDs:         2.45 cm   LV e' medial:    8.92 cm/s LV PW:         0.90 cm   LV E/e' medial:  8.1  LV IVS:        1.05 cm   LV e' lateral:   13.40 cm/s LVOT diam:     2.10 cm   LV E/e' lateral: 5.4 LV SV:         49 LV SV Index:   29        2D Longitudinal Strain LVOT Area:     3.46 cm  2D Strain GLS (A2C):   -18.9 %                          2D Strain GLS (A3C):   -19.6 %                          2D Strain GLS (A4C):   -24.0 %                          2D Strain GLS Avg:     -20.8 %                           3D Volume EF:                          3D EF:        75 %                          LV EDV:       106 ml                          LV ESV:       26 ml                          LV SV:        80 ml RIGHT VENTRICLE RV Basal diam:  2.90 cm RV S prime:     19.70 cm/s TAPSE (M-mode): 2.2 cm LEFT ATRIUM             Index        RIGHT ATRIUM           Index LA diam:        3.70 cm 2.15 cm/m   RA Area:     12.30 cm LA Vol (A2C):   35.4 ml 20.61 ml/m  RA Volume:   27.90 ml  16.24 ml/m LA Vol (A4C):   45.6 ml 26.54 ml/m LA Biplane Vol: 43.9 ml 25.55 ml/m  AORTIC VALVE LVOT Vmax:   92.27 cm/s LVOT Vmean:  60.733 cm/s LVOT VTI:    0.142 m  AORTA Ao Root diam: 2.70 cm Ao Asc diam:  2.80 cm MITRAL VALVE MV Area (PHT): cm         SHUNTS MV Decel Time: 156 msec    Systemic VTI:  0.14 m MV E velocity: 72.60 cm/s  Systemic Diam: 2.10 cm MV A velocity: 83.00 cm/s MV E/A ratio:  0.87 Buford Dresser MD Electronically signed by Buford Dresser MD Signature Date/Time: 10/16/2022/4:43:18 PM    Final    US OB Follow Up  Result Date: 10/14/2022 Patient Name: Migdalia Saleeby DOB: December 27, 1985 MRN:  [id number] ULTRASOUND REPORT Location: [facility] Date of Service: 10/08/2022 Indications:growth/afi- LUS fibroid Findings: Nelda [place]se[place]  intrauterine pregnancy is visualized with FHR at 138 bpm. Biometrics gives an (U/S) Gestational age of 76w1dand an (U/S) EDD of 11/11/2022; this correlates with the clinically established Estimated Date of Delivery: 10/26/22. Fetal presentation is Cephalic. Placenta: anterior. AFI: 12.62 cm Growth percentile is 29th.  AC percentile is 64th EFW: 2866 grams (6lbs 5oz). Single uterine fibroid identified in left lower uterine segment, 6.3 x 6.4 cm. Impression: 1. 349w3diable Single Intrauterine pregnancy dated by previously established criteria. 2. Growth is 29th %ile.  AFI is 12.62 cm. 3. Single uterine fibroid 6.3 x 6.4 cm in lower uterine segment. Recommendations: 1.Clinical correlation with the patient's History and Physical Exam. KrEdwena BundeRDMS, RVT I have reviewed this study and agree with documented findings. AnRubie MaidMD [facility] [place]  [place] OB FOLLOW UP  Result Date: 09/09/2022 ----------------------------------------------------------------------  OBSTETRICS REPORT                       (Signed Final 09/09/2022 04:49 pm) ---------------------------------------------------------------------- Patient Info  ID #:       [id number]                        D.O.B.:  [birth year])  Name:       TAUnice Cobble               Visit Date: 09/09/2022 12:28 pm              [place] ---------------------------------------------------------------------- Performed By  Attending:        ViJohnell ComingsD         Ref. Address:     [address]                                                           BuHarperNCHosmerPerformed By:     DaStephenie Acres      Secondary Phy.:   [facility]                    BS  RDMS  Referred By:      MAMidge Minium           [facility]         [facility]                                                             [place] [facility]                                                             [facility] ---------------------------------------------------------------------- Orders  #  Description                           Code        Ordered By  1  [place] MFM OB FOLLOW UP                   [phone]    Peterson Ao ----------------------------------------------------------------------  #  Order #                     Accession #                Episode #  [id number]  [id number]                   [id number]                 [id number] ---------------------------------------------------------------------- Indications  Advanced maternal age multigravida 18+,        O21.523  third trimester  Uterine fibroids                               O34.10  Encounter for other antenatal screening        Z36.2  follow-up  Negative MaterniT21  [redacted] weeks gestation of pregnancy                Z3A.33 ---------------------------------------------------------------------- Fetal Evaluation  Num Of Fetuses:         1  Fetal Heart Rate(bpm):  148  Cardiac Activity:       Observed  Presentation:           Cephalic  Placenta:               Anterior  P. Cord Insertion:      Previously Visualized  Amniotic Fluid  AFI FV:      Within normal limits  AFI Sum(cm)     %Tile       Largest Pocket(cm)  14.3            50          4.4  RUQ(cm)       RLQ(cm)       LUQ(cm)        LLQ(cm)  3.1           2.9           4.4            3.9 ---------------------------------------------------------------------- Biometry  BPD:      77.7  mm     G. Age:  31w 1d        3.4  %  CI:        72.96   %    70 - 86                                                          FL/HC:      21.6   %    19.9 - 21.5  HC:      289.2  mm     G. Age:  31w 6d        1.7  %    HC/AC:      0.97        0.96 - 1.11  AC:       298.4  mm     G. Age:  33w 6d         68  %    FL/BPD:     80.3   %    71 - 87  FL:       62.4  mm     G. Age:  32w 2d         16  %    FL/AC:      20.9   %    20 - 24  HUM:      54.8  mm     G. Age:  31w 6d         30  %  LV:        4.5  mm  Est. FW:    2081  gm      4 lb 9 oz     31  % ---------------------------------------------------------------------- OB History  Blood Type:   O+  Gravidity:    4          SAB:   3  Living:       0 ---------------------------------------------------------------------- Gestational Age  U/S Today:     32w 2d                                        EDD:   11/02/22  Best:          33w 2d     Det. By:  Loman Chroman         EDD:   10/26/22                                      (04/03/22) ---------------------------------------------------------------------- Anatomy  Cranium:               Appears normal         LVOT:                   Previously seen  Cavum:                 Previously seen        Aortic Arch:            Previously seen  Ventricles:            Appears normal         Ductal Arch:            Previously seen  Choroid Plexus:        Previously seen        Diaphragm:              Appears normal  Cerebellum:            Appears normal         Stomach:                Appears normal, left                                                                        sided  Posterior Fossa:       Appears normal         Abdomen:                Appears normal  Nuchal Fold:           Previously seen        Abdominal Wall:         Previously seen  Face:                  Orbits and profile     Cord Vessels:           Previously seen                         previously seen  Lips:                  Previously seen        Kidneys:                Appear normal  Palate:                Previously seen        Bladder:                Appears normal  Thoracic:              Previously seen        Spine:                  Previously seen  Heart:                 Previously seen        Upper  Extremities:      Previously seen  RVOT:                  Previously seen        Lower Extremities:      Previously seen  Other:  Female gender previously seen. VC, 3VV and 3VTV, Nasal bone,          lenses, maxilla, mandible and falx, Heels/feet and open hands/5th          digits previously visualized. ---------------------------------------------------------------------- Cervix Uterus Adnexa  Comment  Anterior mass - appears herniated out of abd  wall - fibroid? 5.2 x 5 x 1.2cm ---------------------------------------------------------------------- Myomas  Site                     L(cm)      W(cm)      D(cm)  Location  POST LUS                 6.6        5.8        5.8 ----------------------------------------------------------------------  Blood Flow                  RI       PI       Comments ---------------------------------------------------------------------- Comments  This patient was seen for a follow up growth scan due to  advanced maternal age and a fibroid uterus.  She denies any  problems since her last exam.  She was informed that the fetal growth and amniotic fluid  level appears appropriate for her gestational age.  A 5-6 cm lower uterine segment fibroid continues to be noted  on today's exam.  This fibroid appears to be behind her cervix  and should not preclude her from attempting a vaginal  delivery.  As the fetal growth is within normal limits, no further exams  were scheduled in our office. ----------------------------------------------------------------------                   Johnell Comings, MD Electronically Signed Final Report   09/09/2022 04:49 pm ----------------------------------------------------------------------

## 2022-12-03 NOTE — Assessment & Plan Note (Signed)
Labs are reviewed and discussed with patient. Both hemoglobin and Iron panel have improved.  Hold off IV venofer Recommend patient to take Vitron C daily.

## 2023-01-24 ENCOUNTER — Encounter: Payer: Self-pay | Admitting: Oncology

## 2023-01-30 IMAGING — US US OB < 14 WEEKS - US OB TV
1 of 2 series · 13 of 28 positions shown · non-contrast
Comparison: None this pregnancy.

CLINICAL DATA: Threatened abortion, delivered. Patient reports
miscarriage in 01/10/2022.

EXAM:
OBSTETRIC <14 WK US AND TRANSVAGINAL OB US
TECHNIQUE: Both transabdominal and transvaginal ultrasound examinations were
performed for complete evaluation of the gestation as well as the
maternal uterus, adnexal regions, and pelvic cul-de-sac.
Transvaginal technique was performed to assess early pregnancy.

[Series 1: us ob < 14 weeks - us ob tv · 0.12mm/px · 13 of 141 slices shown]
[im 6/141]
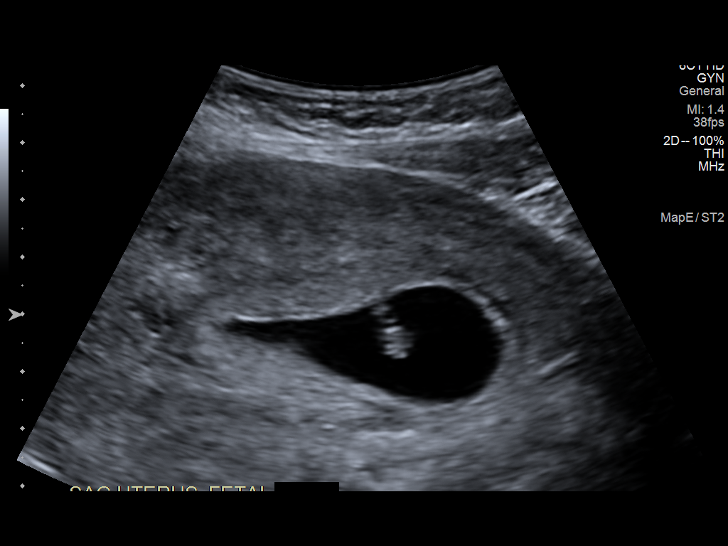
[im 17/141]
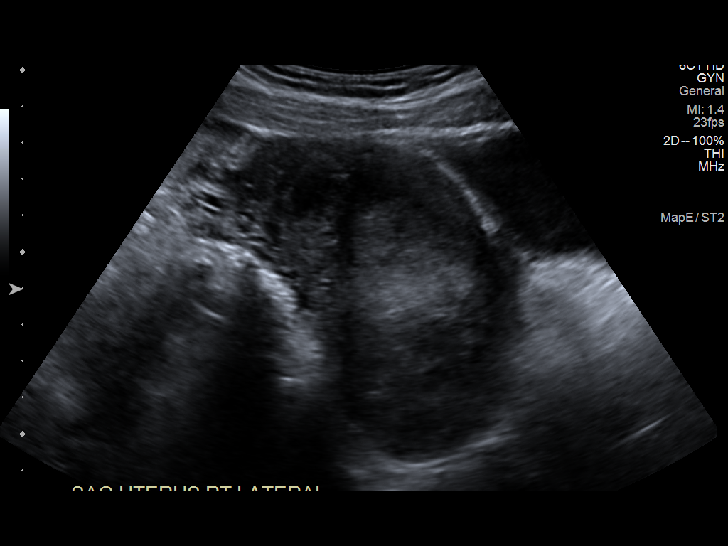
[im 27/141]
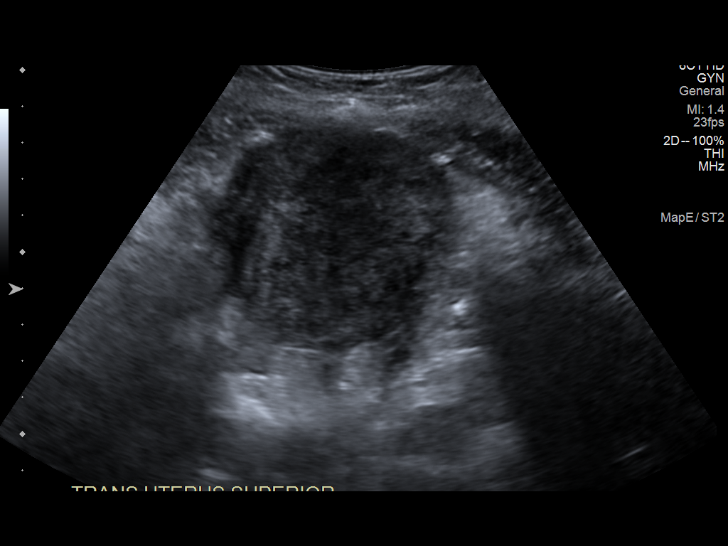
[im 38/141]
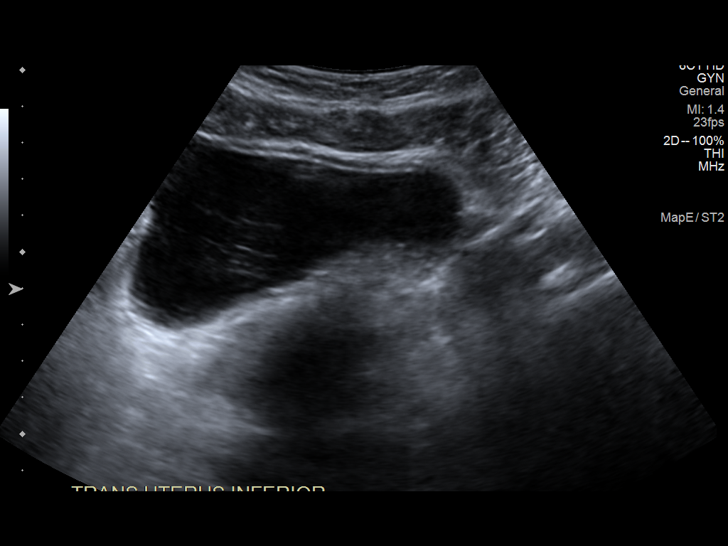
[im 49/141]
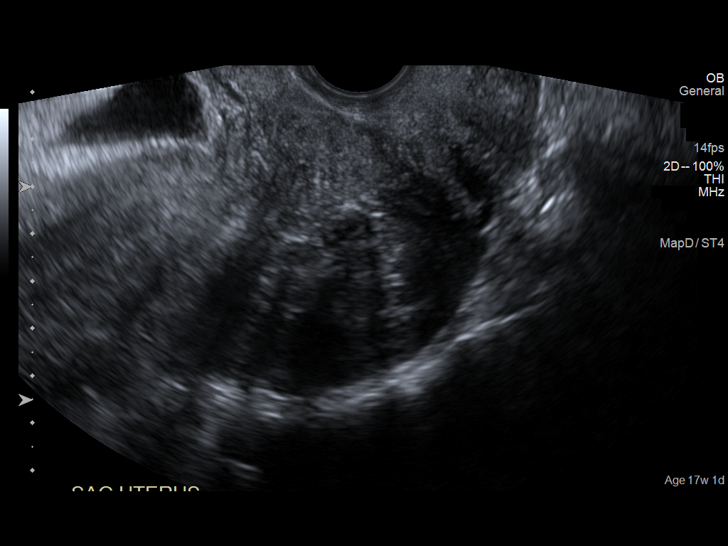
[im 60/141]
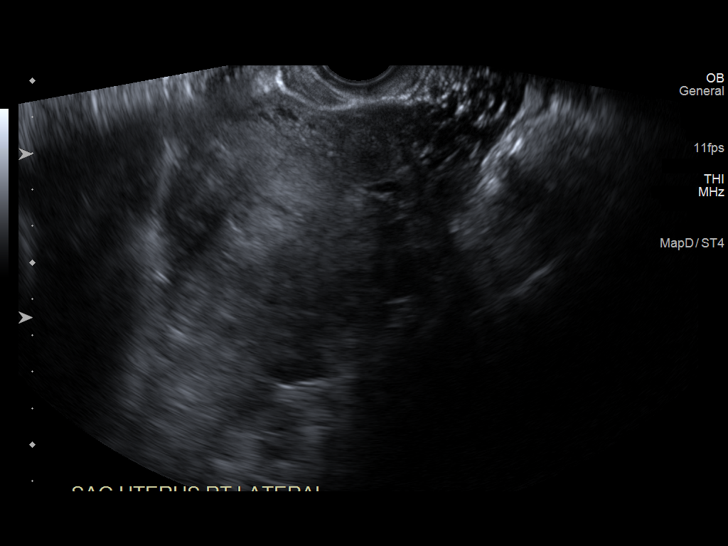
[im 76/141]
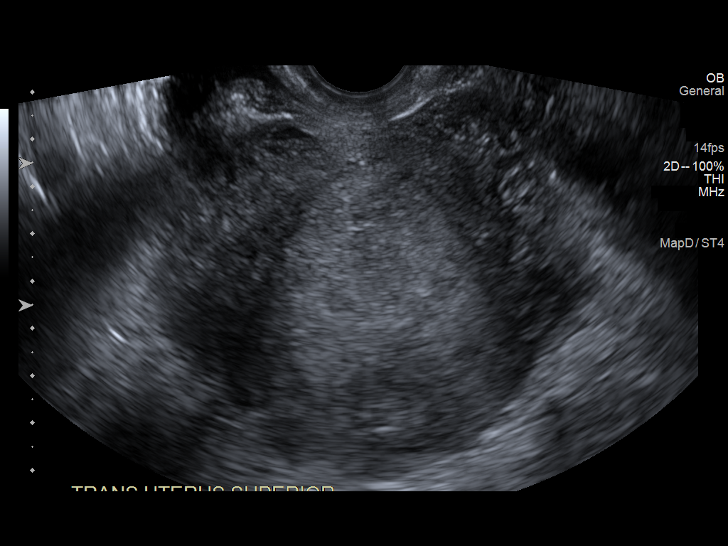
[im 87/141]
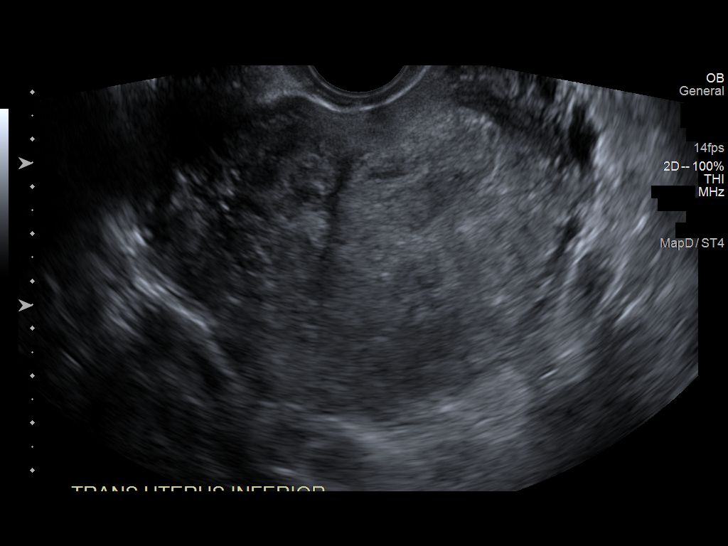
[im 97/141]
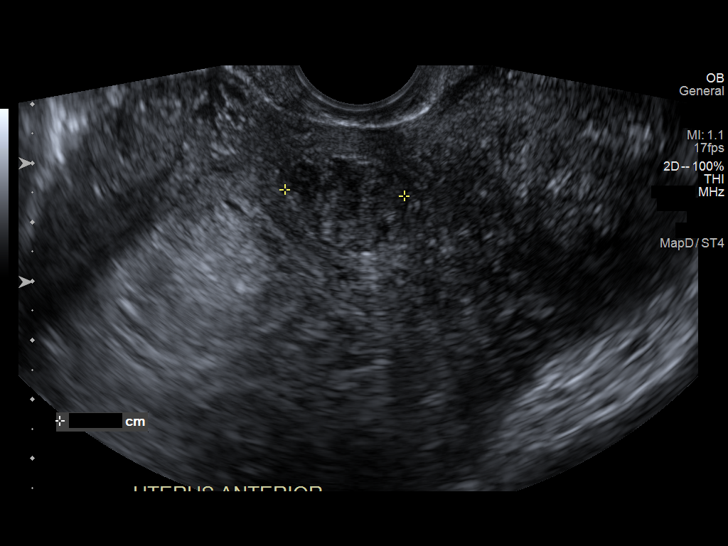
[im 108/141]
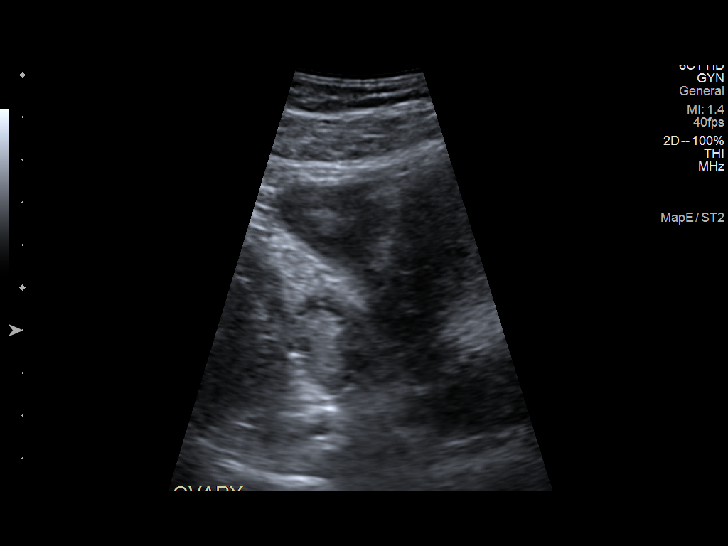
[im 119/141]
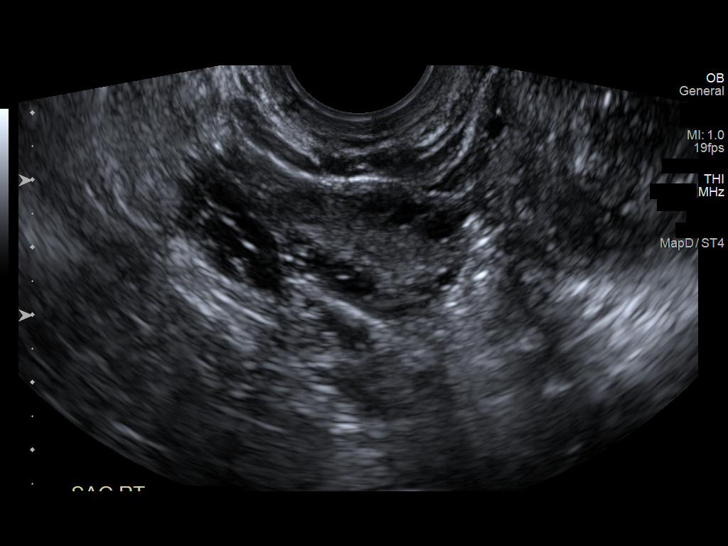
[im 130/141]
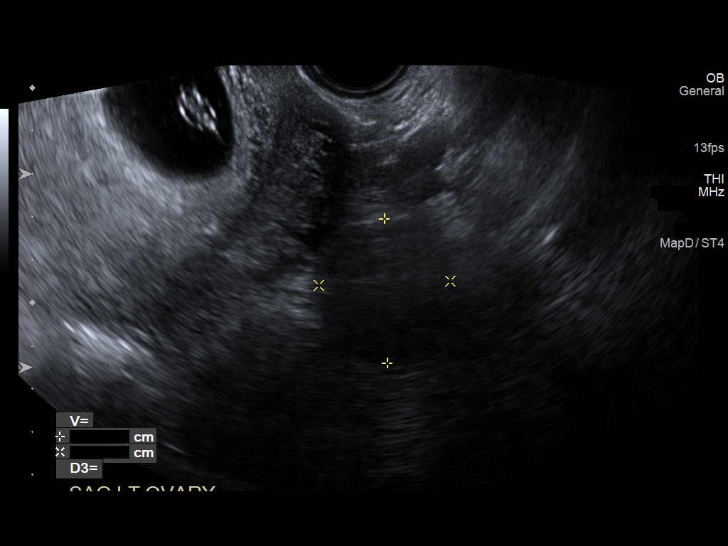
[im 141/141]
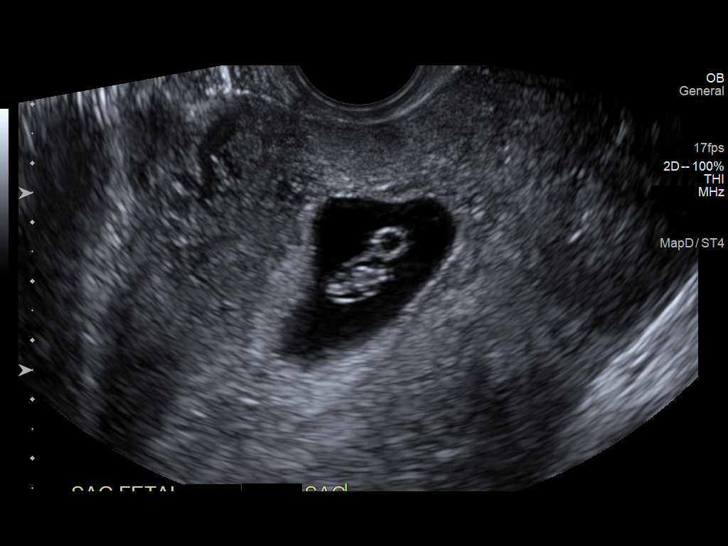

[13 of 28 positions shown; findings below may reference images not displayed]

FINDINGS: Intrauterine gestational sac: Single

Yolk sac:  Visualized.

Embryo:  Visualized.

Cardiac Activity: Visualized.

Heart Rate: 133 bpm

CRL:  10.3 mm   7 w   1 d                  US EDC: 10/28/2022

Subchorionic hemorrhage: Small, both anterior and posterior to the
gestational sac.

Maternal uterus/adnexae: Nabothian cyst in the cervix. Uterine
fibroids including a 4.4 x 3.4 x 4.0 cm intramural fibroid in the
posterior lower uterine segment, there is a 2 cm intramural fibroid.
Both ovaries are visualized and are normal. No significant pelvic
free fluid.
IMPRESSION: 1. Single live intrauterine pregnancy estimated gestational age 7
weeks 1 day based on crown-rump length for ultrasound EDC
10/28/2022.
2. Small subchorionic hemorrhage.
3. Multiple uterine fibroids.

## 2023-03-10 ENCOUNTER — Other Ambulatory Visit: Payer: Self-pay | Admitting: Surgery

## 2023-03-10 DIAGNOSIS — K432 Incisional hernia without obstruction or gangrene: Secondary | ICD-10-CM

## 2023-03-17 ENCOUNTER — Encounter: Payer: Self-pay | Admitting: Oncology

## 2023-03-19 ENCOUNTER — Ambulatory Visit: Payer: No Typology Code available for payment source

## 2023-03-24 ENCOUNTER — Other Ambulatory Visit: Payer: Medicaid Other

## 2023-03-25 ENCOUNTER — Encounter: Payer: Self-pay | Admitting: Oncology

## 2023-03-25 ENCOUNTER — Ambulatory Visit
Admission: RE | Admit: 2023-03-25 | Discharge: 2023-03-25 | Disposition: A | Discharge status: Home / Self Care | Payer: No Typology Code available for payment source | Source: Ambulatory Visit | Attending: Surgery | Admitting: Surgery

## 2023-03-25 DIAGNOSIS — K432 Incisional hernia without obstruction or gangrene: Secondary | ICD-10-CM | POA: Insufficient documentation

## 2023-06-09 ENCOUNTER — Inpatient Hospital Stay: Payer: No Typology Code available for payment source

## 2023-06-16 ENCOUNTER — Ambulatory Visit: Payer: No Typology Code available for payment source | Admitting: Oncology

## 2023-06-16 ENCOUNTER — Ambulatory Visit: Payer: No Typology Code available for payment source

## 2024-02-12 ENCOUNTER — Other Ambulatory Visit: Payer: Self-pay | Admitting: Surgery

## 2024-02-12 DIAGNOSIS — K432 Incisional hernia without obstruction or gangrene: Secondary | ICD-10-CM

## 2024-02-18 ENCOUNTER — Ambulatory Visit
Admission: RE | Admit: 2024-02-18 | Discharge: 2024-02-18 | Disposition: A | Discharge status: Home / Self Care | Source: Ambulatory Visit | Attending: Surgery | Admitting: Surgery

## 2024-02-18 DIAGNOSIS — K432 Incisional hernia without obstruction or gangrene: Secondary | ICD-10-CM | POA: Insufficient documentation

## 2024-03-01 ENCOUNTER — Ambulatory Visit: Payer: Self-pay | Admitting: Surgery

## 2024-03-01 NOTE — H&P (Signed)
 Subjective:   CC: Umbilical hernia without obstruction and without gangrene [K42.9]  HPI:  Cristina Allen is a 38 y.o. female who was referred by Self for evaluation of above. Symptoms were first noted several months ago. Pain is dull and intermittent, confined to the periumbilical area, without radiation.  Associated with lump, exacerbated by nothing.  Lump is reducible.    Past Medical History:  has no past medical history on file.  Past Surgical History:  Past Surgical History:  Procedure Laterality Date   TONSILLECTOMY  2005   DILATION & CURRETTAGE  2006   TONSILLECTOMY      Family History: family history includes Rheum arthritis in her sister.  Social History:  reports that she has never smoked. She has never used smokeless tobacco. She reports current alcohol use. She reports that she does not use drugs.  Current Medications: has a current medication list which includes the following prescription(s): dextroamphetamine-amphetamine, ergocalciferol (vitamin d2), hydroxyzine, and nifedipine.  Allergies:  Allergies as of 03/01/2024   (No Known Allergies)    ROS:  A 15 point review of systems was performed and pertinent positives and negatives noted in HPI   Objective:     BP (!) 145/87   Pulse 98   Ht 154.9 cm (5\' 1" )   Wt 74.8 kg (165 lb)   LMP 02/13/2024   BMI 31.18 kg/m   Constitutional :  Alert, cooperative, no distress  Lymphatics/Throat:  Supple, no lymphadenopathy  Respiratory:  clear to auscultation bilaterally  Cardiovascular:  regular rate and rhythm  Gastrointestinal: soft, non-tender; bowel sounds normal; no masses,  no organomegaly. umbilical hernia noted.  moderate  Musculoskeletal: Steady gait and movement  Skin: Cool and moist  Psychiatric: Normal affect, non-agitated, not confused       LABS:  N/a   RADS: N/a Assessment:       Umbilical hernia without obstruction and without gangrene [K42.9]  Plan:     1. Umbilical hernia without  obstruction and without gangrene [K42.9]   Discussed the risk of surgery including recurrence, which can be up to 50% in the case of incisional or complex hernias, possible use of prosthetic materials (mesh) and the increased risk of mesh infxn if used, bleeding, chronic pain, post-op infxn, post-op SBO or ileus, and possible re-operation to address said risks. The risks of general anesthetic, if used, includes MI, CVA, sudden death or even reaction to anesthetic medications also discussed. Alternatives include continued observation.  Benefits include possible symptom relief, prevention of incarceration, strangulation, enlargement in size over time, and the risk of emergency surgery in the face of strangulation.   Typical post-op recovery time of 3-5 days with 2 weeks of activity restrictions were also discussed.  ED return precautions given for sudden increase in pain, size of hernia with accompanying fever, nausea, and/or vomiting.  The patient verbalized understanding and all questions were answered to the patient's satisfaction.   2. Patient has elected to proceed with surgical treatment. Procedure will be scheduled. umbilical, robotic assisted laparoscopic.  Currently breastfeeding  labs/images/medications/previous chart entries reviewed personally and relevant changes/updates noted above.

## 2024-03-01 NOTE — H&P (View-Only) (Signed)
 Subjective:   CC: Umbilical hernia without obstruction and without gangrene [K42.9]  HPI:  Cristina Allen is a 38 y.o. female who was referred by Self for evaluation of above. Symptoms were first noted several months ago. Pain is dull and intermittent, confined to the periumbilical area, without radiation.  Associated with lump, exacerbated by nothing.  Lump is reducible.    Past Medical History:  has no past medical history on file.  Past Surgical History:  Past Surgical History:  Procedure Laterality Date   TONSILLECTOMY  2005   DILATION & CURRETTAGE  2006   TONSILLECTOMY      Family History: family history includes Rheum arthritis in her sister.  Social History:  reports that she has never smoked. She has never used smokeless tobacco. She reports current alcohol use. She reports that she does not use drugs.  Current Medications: has a current medication list which includes the following prescription(s): dextroamphetamine-amphetamine, ergocalciferol (vitamin d2), hydroxyzine, and nifedipine.  Allergies:  Allergies as of 03/01/2024   (No Known Allergies)    ROS:  A 15 point review of systems was performed and pertinent positives and negatives noted in HPI   Objective:     BP (!) 145/87   Pulse 98   Ht 154.9 cm (5\' 1" )   Wt 74.8 kg (165 lb)   LMP 02/13/2024   BMI 31.18 kg/m   Constitutional :  Alert, cooperative, no distress  Lymphatics/Throat:  Supple, no lymphadenopathy  Respiratory:  clear to auscultation bilaterally  Cardiovascular:  regular rate and rhythm  Gastrointestinal: soft, non-tender; bowel sounds normal; no masses,  no organomegaly. umbilical hernia noted.  moderate  Musculoskeletal: Steady gait and movement  Skin: Cool and moist  Psychiatric: Normal affect, non-agitated, not confused       LABS:  N/a   RADS: N/a Assessment:       Umbilical hernia without obstruction and without gangrene [K42.9]  Plan:     1. Umbilical hernia without  obstruction and without gangrene [K42.9]   Discussed the risk of surgery including recurrence, which can be up to 50% in the case of incisional or complex hernias, possible use of prosthetic materials (mesh) and the increased risk of mesh infxn if used, bleeding, chronic pain, post-op infxn, post-op SBO or ileus, and possible re-operation to address said risks. The risks of general anesthetic, if used, includes MI, CVA, sudden death or even reaction to anesthetic medications also discussed. Alternatives include continued observation.  Benefits include possible symptom relief, prevention of incarceration, strangulation, enlargement in size over time, and the risk of emergency surgery in the face of strangulation.   Typical post-op recovery time of 3-5 days with 2 weeks of activity restrictions were also discussed.  ED return precautions given for sudden increase in pain, size of hernia with accompanying fever, nausea, and/or vomiting.  The patient verbalized understanding and all questions were answered to the patient's satisfaction.   2. Patient has elected to proceed with surgical treatment. Procedure will be scheduled. umbilical, robotic assisted laparoscopic.  Currently breastfeeding  labs/images/medications/previous chart entries reviewed personally and relevant changes/updates noted above.

## 2024-03-09 ENCOUNTER — Encounter
Admission: RE | Admit: 2024-03-09 | Discharge: 2024-03-09 | Disposition: A | Discharge status: Home / Self Care | Source: Ambulatory Visit | Attending: Surgery | Admitting: Surgery

## 2024-03-09 ENCOUNTER — Other Ambulatory Visit: Payer: Self-pay

## 2024-03-09 DIAGNOSIS — Z01812 Encounter for preprocedural laboratory examination: Secondary | ICD-10-CM

## 2024-03-09 DIAGNOSIS — I10 Essential (primary) hypertension: Secondary | ICD-10-CM

## 2024-03-09 HISTORY — DX: Prediabetes: R73.03

## 2024-03-09 HISTORY — DX: Anemia, unspecified: D64.9

## 2024-03-09 HISTORY — DX: Headache, unspecified: R51.9

## 2024-03-09 HISTORY — DX: Dyspnea, unspecified: R06.00

## 2024-03-09 HISTORY — DX: Palpitations: R00.2

## 2024-03-09 HISTORY — DX: Essential (primary) hypertension: I10

## 2024-03-09 HISTORY — DX: Other specified postprocedural states: Z98.890

## 2024-03-09 NOTE — Patient Instructions (Addendum)
 Your procedure is scheduled on: 03/11/24 - Thursday Report to the Registration Desk on the 1st floor of the [facility]. To find out your arrival time, please call [phone] between 1PM - 3PM on: 03/10/24 - Wednesday If your arrival time is 6:00 am, do not arrive before that time as the [facility] entrance doors do not open until 6:00 am.  REMEMBER: Instructions that are not followed completely may result in serious medical risk, up to and including death; or upon the discretion of your surgeon and anesthesiologist your surgery may need to be rescheduled.  Do not eat food after midnight the night before surgery.  No gum chewing or hard candies.  You may however, drink CLEAR liquids up to 2 hours before you are scheduled to arrive for your surgery. Do not drink anything within 2 hours of your scheduled arrival time.  Clear liquids include: - water  - apple juice without pulp - gatorade (not RED colors) - black coffee or tea (Do NOT add milk or creamers to the coffee or tea) Do NOT drink anything that is not on this list.  One week prior to surgery: Stop Anti-inflammatories (NSAIDS) such as Advil , Aleve, Ibuprofen , Motrin , Naproxen, Naprosyn and Aspirin based products such as Excedrin, Goody's Powder, BC Powder. You may take Tylenol  if needed for pain up until the day of surgery.  Stop ANY OVER THE COUNTER supplements until after surgery : magnesium oxide   Continue taking all of your other prescription medications up until the day before surgery.  ON THE DAY OF SURGERY ONLY TAKE THESE MEDICATIONS WITH SIPS OF WATER:  NIFEdipine (PROCARDIA)    No Alcohol for 24 hours before or after surgery.  No Smoking including e-cigarettes for 24 hours before surgery.  No chewable tobacco products for at least 6 hours before surgery.  No nicotine patches on the day of surgery.  Do not use any "recreational" drugs for at least a week (preferably 2 weeks) before your surgery.  Please be  advised that the combination of cocaine and anesthesia may have negative outcomes, up to and including death. If you test positive for cocaine, your surgery will be cancelled.  On the morning of surgery brush your teeth with toothpaste and water, you may rinse your mouth with mouthwash if you wish. Do not swallow any toothpaste or mouthwash.  Use CHG Soap or wipes as directed on instruction sheet.  Do not wear jewelry, make-up, hairpins, clips or nail polish.  For welded (permanent) jewelry: bracelets, anklets, waist bands, etc.  Please have this removed prior to surgery.  If it is not removed, there is a chance that hospital personnel will need to cut it off on the day of surgery.  Do not wear lotions, powders, or perfumes.   Do not shave body hair from the neck down 48 hours before surgery.  Contact lenses, hearing aids and dentures may not be worn into surgery.  Do not bring valuables to the hospital. [facility] is not responsible for any missing/lost belongings or valuables.   Notify your doctor if there is any change in your medical condition (cold, fever, infection).  Wear comfortable clothing (specific to your surgery type) to the hospital.  After surgery, you can help prevent lung complications by doing breathing exercises.  Take deep breaths and cough every 1-2 hours. Your doctor may order a device called an Incentive Spirometer to help you take deep breaths.  When coughing or sneezing, hold a pillow firmly against your  incision with both hands. This is called "splinting." Doing this helps protect your incision. It also decreases belly discomfort.  If you are being admitted to the hospital overnight, leave your suitcase in the car. After surgery it may be brought to your room.  In case of increased patient census, it may be necessary for you, the patient, to continue your postoperative care in the Same Day Surgery department.  If you are being discharged the day of surgery,  you will not be allowed to drive home. You will need a responsible individual to drive you home and stay with you for 24 hours after surgery.   If you are taking public transportation, you will need to have a responsible individual with you.  Please call the Pre-admissions Testing Dept. at [phone] if you have any questions about these instructions.  Surgery Visitation Policy:  Patients having surgery or a procedure may have two visitors.  Children under the age of 58 must have an adult with them who is not the patient.  Inpatient Visitation:    Visiting hours are 7 a.m. to 8 p.m. Up to four visitors are allowed at one time in a patient room. The visitors may rotate out with other people during the day.  One visitor age 22 or older may stay with the patient overnight and must be in the room by 8 p.m.    Preparing for Surgery with CHLORHEXIDINE GLUCONATE (CHG) Soap  Chlorhexidine Gluconate (CHG) Soap  o An antiseptic cleaner that kills germs and bonds with the skin to continue killing germs even after washing  o Used for showering the night before surgery and morning of surgery  Before surgery, you can play an important role by reducing the number of germs on your skin.  CHG (Chlorhexidine gluconate) soap is an antiseptic cleanser which kills germs and bonds with the skin to continue killing germs even after washing.  Please do not use if you have an allergy to CHG or antibacterial soaps. If your skin becomes reddened/irritated stop using the CHG.  1. Shower the NIGHT BEFORE SURGERY and the MORNING OF SURGERY with CHG soap.  2. If you choose to wash your hair, wash your hair first as usual with your normal shampoo.  3. After shampooing, rinse your hair and body thoroughly to remove the shampoo.  4. Use CHG as you would any other liquid soap. You can apply CHG directly to the skin and wash gently with a scrungie or a clean washcloth.  5. Apply the CHG soap to your body  only from the neck down. Do not use on open wounds or open sores. Avoid Breast if you are breast feeding. Avoid contact with your eyes, ears, mouth, and genitals (private parts). Wash face and genitals (private parts) with your normal soap.  6. Wash thoroughly, paying special attention to the area where your surgery will be performed.  7. Thoroughly rinse your body with warm water.  8. Do not shower/wash with your normal soap after using and rinsing off the CHG soap.  9. Pat yourself dry with a clean towel.  10. Wear clean pajamas to bed the night before surgery.  12. Place clean sheets on your bed the night of your first shower and do not sleep with pets.  13. Shower again with the CHG soap on the day of surgery prior to arriving at the hospital.  14. Do not apply any deodorants/lotions/powders.  15. Please wear clean clothes to the hospital.

## 2024-03-10 ENCOUNTER — Inpatient Hospital Stay: Admission: RE | Admit: 2024-03-10 | Source: Ambulatory Visit

## 2024-03-10 ENCOUNTER — Encounter
Admission: RE | Admit: 2024-03-10 | Discharge: 2024-03-10 | Disposition: A | Discharge status: Home / Self Care | Source: Ambulatory Visit | Attending: Surgery | Admitting: Surgery

## 2024-03-10 DIAGNOSIS — Z01818 Encounter for other preprocedural examination: Secondary | ICD-10-CM | POA: Insufficient documentation

## 2024-03-10 DIAGNOSIS — Z01812 Encounter for preprocedural laboratory examination: Secondary | ICD-10-CM

## 2024-03-10 DIAGNOSIS — I10 Essential (primary) hypertension: Secondary | ICD-10-CM | POA: Insufficient documentation

## 2024-03-10 DIAGNOSIS — Z01810 Encounter for preprocedural cardiovascular examination: Secondary | ICD-10-CM | POA: Diagnosis not present

## 2024-03-10 LAB — BASIC METABOLIC PANEL WITH GFR
Anion gap: 8 (ref 5–15)
BUN: 9 mg/dL (ref 6–20)
CO2: 24 mmol/L (ref 22–32)
Calcium: 8.9 mg/dL (ref 8.9–10.3)
Chloride: 107 mmol/L (ref 98–111)
Creatinine, Ser: 0.84 mg/dL (ref 0.44–1.00)
GFR, Estimated: >60 mL/min (ref >60)
Glucose, Bld: 106 mg/dL — ABNORMAL HIGH (ref 70–99)
Potassium: 3.9 mmol/L (ref 3.5–5.1)
Sodium: 139 mmol/L (ref 135–145)

## 2024-03-10 LAB — CBC
HCT: 33.1 % — ABNORMAL LOW (ref 36.0–46.0)
Hemoglobin: 10.7 g/dL — ABNORMAL LOW (ref 12.0–15.0)
MCH: 23.6 pg — ABNORMAL LOW (ref 26.0–34.0)
MCHC: 32.3 g/dL (ref 30.0–36.0)
MCV: 73.1 fL — ABNORMAL LOW (ref 80.0–100.0)
Platelets: 243 10*3/uL (ref 150–400)
RBC: 4.53 MIL/uL (ref 3.87–5.11)
RDW: 16.2 % — ABNORMAL HIGH (ref 11.5–15.5)
WBC: 6.6 10*3/uL (ref 4.0–10.5)
nRBC: 0 % (ref 0.0–0.2)

## 2024-03-11 ENCOUNTER — Ambulatory Visit
Admission: RE | Admit: 2024-03-11 | Discharge: 2024-03-11 | Disposition: A | Discharge status: Home / Self Care | Attending: Surgery | Admitting: Surgery

## 2024-03-11 ENCOUNTER — Ambulatory Visit: Admitting: Anesthesiology

## 2024-03-11 ENCOUNTER — Ambulatory Visit: Admitting: Urgent Care

## 2024-03-11 ENCOUNTER — Other Ambulatory Visit: Payer: Self-pay

## 2024-03-11 ENCOUNTER — Encounter
Admission: RE | Disposition: A | Discharge status: Home / Self Care | Payer: Self-pay | Source: Home / Self Care | Attending: Surgery

## 2024-03-11 DIAGNOSIS — I10 Essential (primary) hypertension: Secondary | ICD-10-CM

## 2024-03-11 DIAGNOSIS — K436 Other and unspecified ventral hernia with obstruction, without gangrene: Secondary | ICD-10-CM | POA: Insufficient documentation

## 2024-03-11 DIAGNOSIS — Z01812 Encounter for preprocedural laboratory examination: Secondary | ICD-10-CM

## 2024-03-11 DIAGNOSIS — K420 Umbilical hernia with obstruction, without gangrene: Secondary | ICD-10-CM | POA: Diagnosis not present

## 2024-03-11 LAB — POCT PREGNANCY, URINE: Preg Test, Ur: NEGATIVE

## 2024-03-11 SURGERY — REPAIR, HERNIA, UMBILICAL, ROBOT-ASSISTED
Anesthesia: General | Site: Abdomen

## 2024-03-11 MED ORDER — SODIUM CHLORIDE (PF) 0.9 % IJ SOLN
INTRAMUSCULAR | Status: AC
  Filled 2024-03-11: qty 10

## 2024-03-11 MED ORDER — LACTATED RINGERS IV SOLN
INTRAVENOUS | Status: DC
Start: 2024-03-11 — End: 2024-03-11

## 2024-03-11 MED ORDER — GABAPENTIN 300 MG PO CAPS
ORAL_CAPSULE | ORAL | Status: AC
  Filled 2024-03-11: qty 1

## 2024-03-11 MED ORDER — LIDOCAINE HCL (CARDIAC) PF 100 MG/5ML IV SOSY
PREFILLED_SYRINGE | INTRAVENOUS | Status: DC | PRN
  Administered 2024-03-11: 60 mg via INTRAVENOUS

## 2024-03-11 MED ORDER — ROCURONIUM BROMIDE 10 MG/ML (PF) SYRINGE
PREFILLED_SYRINGE | INTRAVENOUS | Status: AC
  Filled 2024-03-11: qty 10

## 2024-03-11 MED ORDER — DROPERIDOL 2.5 MG/ML IJ SOLN
INTRAMUSCULAR | Status: AC
  Filled 2024-03-11: qty 2

## 2024-03-11 MED ORDER — CEFAZOLIN SODIUM-DEXTROSE 2-4 GM/100ML-% IV SOLN
2.0000 g | INTRAVENOUS | Status: AC
  Administered 2024-03-11: 2 g via INTRAVENOUS

## 2024-03-11 MED ORDER — OXYCODONE HCL 5 MG PO TABS
ORAL_TABLET | ORAL | Status: AC
  Filled 2024-03-11: qty 1

## 2024-03-11 MED ORDER — CELECOXIB 200 MG PO CAPS
ORAL_CAPSULE | ORAL | Status: AC
  Filled 2024-03-11: qty 1

## 2024-03-11 MED ORDER — OXYCODONE HCL 5 MG/5ML PO SOLN: 5.0000 mg | Freq: Once | ORAL | Status: AC | PRN

## 2024-03-11 MED ORDER — DOCUSATE SODIUM 100 MG PO CAPS: 100.0000 mg | ORAL_CAPSULE | Freq: Two times a day (BID) | ORAL | 0 refills | Status: AC | PRN

## 2024-03-11 MED ORDER — LIDOCAINE HCL (PF) 2 % IJ SOLN
INTRAMUSCULAR | Status: AC
  Filled 2024-03-11: qty 5

## 2024-03-11 MED ORDER — CELECOXIB 200 MG PO CAPS
200.0000 mg | ORAL_CAPSULE | ORAL | Status: AC
  Administered 2024-03-11: 200 mg via ORAL

## 2024-03-11 MED ORDER — ACETAMINOPHEN 500 MG PO TABS
ORAL_TABLET | ORAL | Status: AC
  Filled 2024-03-11: qty 2

## 2024-03-11 MED ORDER — OXYCODONE-ACETAMINOPHEN 5-325 MG PO TABS: 1.0000 | ORAL_TABLET | Freq: Three times a day (TID) | ORAL | 0 refills | Status: AC | PRN

## 2024-03-11 MED ORDER — ONDANSETRON HCL 4 MG/2ML IJ SOLN
INTRAMUSCULAR | Status: DC | PRN
  Administered 2024-03-11: 4 mg via INTRAVENOUS

## 2024-03-11 MED ORDER — ACETAMINOPHEN 500 MG PO TABS
1000.0000 mg | ORAL_TABLET | ORAL | Status: AC
  Administered 2024-03-11: 1000 mg via ORAL

## 2024-03-11 MED ORDER — SODIUM CHLORIDE (PF) 0.9 % IJ SOLN
INTRAMUSCULAR | Status: AC
  Filled 2024-03-11: qty 20

## 2024-03-11 MED ORDER — ACETAMINOPHEN 10 MG/ML IV SOLN: 1000.0000 mg | Freq: Once | INTRAVENOUS | Status: DC | PRN

## 2024-03-11 MED ORDER — BUPIVACAINE LIPOSOME 1.3 % IJ SUSP
INTRAMUSCULAR | Status: AC
  Filled 2024-03-11: qty 20

## 2024-03-11 MED ORDER — FENTANYL CITRATE (PF) 100 MCG/2ML IJ SOLN
25.0000 ug | INTRAMUSCULAR | Status: AC | PRN
  Administered 2024-03-11: 50 ug via INTRAVENOUS
  Administered 2024-03-11: 25 ug via INTRAVENOUS
  Administered 2024-03-11: 50 ug via INTRAVENOUS
  Administered 2024-03-11 (×3): 25 ug via INTRAVENOUS

## 2024-03-11 MED ORDER — SODIUM CHLORIDE (PF) 0.9 % IJ SOLN
INTRAMUSCULAR | Status: DC | PRN
  Administered 2024-03-11: 80 mL

## 2024-03-11 MED ORDER — CHLORHEXIDINE GLUCONATE 0.12 % MT SOLN
15.0000 mL | Freq: Once | OROMUCOSAL | Status: AC
  Administered 2024-03-11: 15 mL via OROMUCOSAL

## 2024-03-11 MED ORDER — DEXAMETHASONE SODIUM PHOSPHATE 10 MG/ML IJ SOLN
INTRAMUSCULAR | Status: DC | PRN
  Administered 2024-03-11: 5 mg via INTRAVENOUS

## 2024-03-11 MED ORDER — CHLORHEXIDINE GLUCONATE CLOTH 2 % EX PADS: 6.0000 | MEDICATED_PAD | Freq: Once | CUTANEOUS | Status: DC

## 2024-03-11 MED ORDER — OXYCODONE HCL 5 MG PO TABS
5.0000 mg | ORAL_TABLET | Freq: Once | ORAL | Status: AC | PRN
  Administered 2024-03-11: 5 mg via ORAL

## 2024-03-11 MED ORDER — MIDAZOLAM HCL 2 MG/2ML IJ SOLN
INTRAMUSCULAR | Status: AC
  Filled 2024-03-11: qty 2

## 2024-03-11 MED ORDER — PHENYLEPHRINE 80 MCG/ML (10ML) SYRINGE FOR IV PUSH (FOR BLOOD PRESSURE SUPPORT)
PREFILLED_SYRINGE | INTRAVENOUS | Status: DC | PRN
  Administered 2024-03-11: 80 ug via INTRAVENOUS

## 2024-03-11 MED ORDER — FENTANYL CITRATE (PF) 100 MCG/2ML IJ SOLN
INTRAMUSCULAR | Status: AC
  Filled 2024-03-11: qty 2

## 2024-03-11 MED ORDER — DROPERIDOL 2.5 MG/ML IJ SOLN
0.6250 mg | Freq: Once | INTRAMUSCULAR | Status: AC | PRN
  Administered 2024-03-11: 0.625 mg via INTRAVENOUS

## 2024-03-11 MED ORDER — GABAPENTIN 300 MG PO CAPS
300.0000 mg | ORAL_CAPSULE | ORAL | Status: AC
  Administered 2024-03-11: 300 mg via ORAL

## 2024-03-11 MED ORDER — PROPOFOL 10 MG/ML IV BOLUS
INTRAVENOUS | Status: AC
  Filled 2024-03-11: qty 20

## 2024-03-11 MED ORDER — SUGAMMADEX SODIUM 200 MG/2ML IV SOLN
INTRAVENOUS | Status: DC | PRN
  Administered 2024-03-11: 200 mg via INTRAVENOUS

## 2024-03-11 MED ORDER — OXYCODONE HCL 5 MG PO TABS
5.0000 mg | ORAL_TABLET | Freq: Once | ORAL | Status: AC
  Administered 2024-03-11: 5 mg via ORAL

## 2024-03-11 MED ORDER — ROCURONIUM BROMIDE 100 MG/10ML IV SOLN
INTRAVENOUS | Status: DC | PRN
  Administered 2024-03-11: 50 mg via INTRAVENOUS
  Administered 2024-03-11: 10 mg via INTRAVENOUS
  Administered 2024-03-11: 20 mg via INTRAVENOUS

## 2024-03-11 MED ORDER — FENTANYL CITRATE (PF) 100 MCG/2ML IJ SOLN
INTRAMUSCULAR | Status: AC
Start: 2024-03-11 — End: 2024-03-11
  Filled 2024-03-11: qty 2

## 2024-03-11 MED ORDER — PROPOFOL 10 MG/ML IV BOLUS
INTRAVENOUS | Status: DC | PRN
  Administered 2024-03-11: 150 mg via INTRAVENOUS

## 2024-03-11 MED ORDER — IBUPROFEN 800 MG PO TABS: 800.0000 mg | ORAL_TABLET | Freq: Three times a day (TID) | ORAL | 0 refills | Status: AC | PRN

## 2024-03-11 MED ORDER — 0.9 % SODIUM CHLORIDE (POUR BTL) OPTIME
TOPICAL | Status: DC | PRN
Start: 2024-03-11 — End: 2024-03-11
  Administered 2024-03-11: 500 mL

## 2024-03-11 MED ORDER — PHENYLEPHRINE 80 MCG/ML (10ML) SYRINGE FOR IV PUSH (FOR BLOOD PRESSURE SUPPORT)
PREFILLED_SYRINGE | INTRAVENOUS | Status: AC
  Filled 2024-03-11: qty 10

## 2024-03-11 MED ORDER — MIDAZOLAM HCL 2 MG/2ML IJ SOLN
INTRAMUSCULAR | Status: DC | PRN
  Administered 2024-03-11: 2 mg via INTRAVENOUS

## 2024-03-11 MED ORDER — BUPIVACAINE HCL (PF) 0.5 % IJ SOLN
INTRAMUSCULAR | Status: AC
  Filled 2024-03-11: qty 30

## 2024-03-11 MED ORDER — FENTANYL CITRATE (PF) 100 MCG/2ML IJ SOLN
INTRAMUSCULAR | Status: DC | PRN
  Administered 2024-03-11 (×2): 50 ug via INTRAVENOUS

## 2024-03-11 MED ORDER — CHLORHEXIDINE GLUCONATE 0.12 % MT SOLN
OROMUCOSAL | Status: AC
  Filled 2024-03-11: qty 15

## 2024-03-11 MED ORDER — ORAL CARE MOUTH RINSE: 15.0000 mL | Freq: Once | OROMUCOSAL | Status: AC

## 2024-03-11 MED ORDER — ACETAMINOPHEN 325 MG PO TABS: 650.0000 mg | ORAL_TABLET | Freq: Three times a day (TID) | ORAL | 0 refills | Status: AC | PRN

## 2024-03-11 MED ORDER — SEVOFLURANE IN SOLN
RESPIRATORY_TRACT | Status: AC
  Filled 2024-03-11: qty 250

## 2024-03-11 MED ORDER — CEFAZOLIN SODIUM-DEXTROSE 2-4 GM/100ML-% IV SOLN
INTRAVENOUS | Status: AC
  Filled 2024-03-11: qty 100

## 2024-03-11 MED ORDER — HYDROMORPHONE HCL 1 MG/ML IJ SOLN
INTRAMUSCULAR | Status: DC | PRN
  Administered 2024-03-11 (×2): .5 mg via INTRAVENOUS

## 2024-03-11 MED ORDER — DEXAMETHASONE SODIUM PHOSPHATE 10 MG/ML IJ SOLN
INTRAMUSCULAR | Status: AC
  Filled 2024-03-11: qty 1

## 2024-03-11 MED ORDER — HYDROMORPHONE HCL 1 MG/ML IJ SOLN
INTRAMUSCULAR | Status: AC
  Filled 2024-03-11: qty 1

## 2024-03-11 MED ORDER — ONDANSETRON HCL 4 MG/2ML IJ SOLN
INTRAMUSCULAR | Status: AC
  Filled 2024-03-11: qty 2

## 2024-03-11 SURGICAL SUPPLY — 41 items
COVER TIP SHEARS 8 DVNC (MISCELLANEOUS) ×1 IMPLANT
COVER WAND RF STERILE (DRAPES) ×1 IMPLANT
DERMABOND ADVANCED .7 DNX12 (GAUZE/BANDAGES/DRESSINGS) ×1 IMPLANT
DRAPE ARM DVNC X/XI (DISPOSABLE) ×3 IMPLANT
DRAPE COLUMN DVNC XI (DISPOSABLE) ×1 IMPLANT
ELECTRODE REM PT RTRN 9FT ADLT (ELECTROSURGICAL) ×1 IMPLANT
FORCEPS BPLR FENES DVNC XI (FORCEP) ×1 IMPLANT
GLOVE BIOGEL PI IND STRL 7.0 (GLOVE) ×2 IMPLANT
GLOVE SURG SYN 6.5 ES PF (GLOVE) ×4 IMPLANT
GLOVE SURG SYN 6.5 PF PI (GLOVE) ×4 IMPLANT
GOWN STRL REUS W/ TWL LRG LVL3 (GOWN DISPOSABLE) ×4 IMPLANT
GRASPER SUT TROCAR 14GX15 (MISCELLANEOUS) IMPLANT
IRRIGATOR SUCT 8 DISP DVNC XI (IRRIGATION / IRRIGATOR) IMPLANT
IV NS 1000ML BAXH (IV SOLUTION) IMPLANT
LABEL OR SOLS (LABEL) ×1 IMPLANT
MANIFOLD NEPTUNE II (INSTRUMENTS) ×1 IMPLANT
MESH PROGRIP HERNIA FLAT 15X15 (Mesh General) IMPLANT
NDL DRIVE SUT CUT DVNC (INSTRUMENTS) ×1 IMPLANT
NDL HYPO 22X1.5 SAFETY MO (MISCELLANEOUS) ×1 IMPLANT
NDL INSUFFLATION 14GA 120MM (NEEDLE) ×1 IMPLANT
NEEDLE DRIVE SUT CUT DVNC (INSTRUMENTS) ×1 IMPLANT
NEEDLE HYPO 22X1.5 SAFETY MO (MISCELLANEOUS) ×1 IMPLANT
NEEDLE INSUFFLATION 14GA 120MM (NEEDLE) ×1 IMPLANT
OBTURATOR OPTICALSTD 8 DVNC (TROCAR) ×1 IMPLANT
PACK LAP CHOLECYSTECTOMY (MISCELLANEOUS) ×1 IMPLANT
SCISSORS MNPLR CVD DVNC XI (INSTRUMENTS) ×1 IMPLANT
SEAL UNIV 5-12 XI (MISCELLANEOUS) ×3 IMPLANT
SET TUBE SMOKE EVAC HIGH FLOW (TUBING) ×1 IMPLANT
SOLUTION ELECTROSURG ANTI STCK (MISCELLANEOUS) ×1 IMPLANT
SUT MNCRL AB 4-0 PS2 18 (SUTURE) ×1 IMPLANT
SUT STRATA 3-0 30 PS-1 (SUTURE) IMPLANT
SUT STRATA 3-0 SH (SUTURE) IMPLANT
SUT VIC AB 3-0 SH 27X BRD (SUTURE) IMPLANT
SUT VICRYL 0 UR6 27IN ABS (SUTURE) ×1 IMPLANT
SUTURE STRATFX 0 PDS+ CT-2 23 (SUTURE) ×1 IMPLANT
SYR 30ML LL (SYRINGE) ×1 IMPLANT
SYSTEM WECK SHIELD CLOSURE (TROCAR) IMPLANT
TRAP FLUID SMOKE EVACUATOR (MISCELLANEOUS) ×1 IMPLANT
TRAY FOLEY MTR SLVR 16FR STAT (SET/KITS/TRAYS/PACK) ×1 IMPLANT
TROCAR Z-THREAD FIOS 5X100MM (TROCAR) IMPLANT
WATER STERILE IRR 500ML POUR (IV SOLUTION) ×1 IMPLANT

## 2024-03-11 NOTE — H&P (Signed)
 Subjective:   CC: Umbilical hernia without obstruction and without gangrene [K42.9]  HPI:  Cristina Allen is a 38 y.o. female who was referred by Self for evaluation of above. Symptoms were first noted several months ago. Pain is dull and intermittent, confined to the periumbilical area, without radiation.  Associated with lump, exacerbated by nothing.  Lump is reducible.    Past Medical History:  has no past medical history on file.  Past Surgical History:  Past Surgical History:  Procedure Laterality Date   TONSILLECTOMY  2005   DILATION & CURRETTAGE  2006   TONSILLECTOMY      Family History: family history includes Rheum arthritis in her sister.  Social History:  reports that she has never smoked. She has never used smokeless tobacco. She reports current alcohol use. She reports that she does not use drugs.  Current Medications: has a current medication list which includes the following prescription(s): dextroamphetamine-amphetamine, ergocalciferol (vitamin d2), hydroxyzine, and nifedipine.  Allergies:  Allergies as of 03/01/2024   (No Known Allergies)    ROS:  A 15 point review of systems was performed and pertinent positives and negatives noted in HPI   Objective:     BP (!) 145/87   Pulse 98   Ht 154.9 cm (5\' 1" )   Wt 74.8 kg (165 lb)   LMP 02/13/2024   BMI 31.18 kg/m   Constitutional :  Alert, cooperative, no distress  Lymphatics/Throat:  Supple, no lymphadenopathy  Respiratory:  clear to auscultation bilaterally  Cardiovascular:  regular rate and rhythm  Gastrointestinal: soft, non-tender; bowel sounds normal; no masses,  no organomegaly. umbilical hernia noted.  moderate  Musculoskeletal: Steady gait and movement  Skin: Cool and moist  Psychiatric: Normal affect, non-agitated, not confused       LABS:  N/a   RADS: N/a Assessment:       Umbilical hernia without obstruction and without gangrene [K42.9]  Plan:     1. Umbilical hernia without  obstruction and without gangrene [K42.9]   Discussed the risk of surgery including recurrence, which can be up to 50% in the case of incisional or complex hernias, possible use of prosthetic materials (mesh) and the increased risk of mesh infxn if used, bleeding, chronic pain, post-op infxn, post-op SBO or ileus, and possible re-operation to address said risks. The risks of general anesthetic, if used, includes MI, CVA, sudden death or even reaction to anesthetic medications also discussed. Alternatives include continued observation.  Benefits include possible symptom relief, prevention of incarceration, strangulation, enlargement in size over time, and the risk of emergency surgery in the face of strangulation.   Typical post-op recovery time of 3-5 days with 2 weeks of activity restrictions were also discussed.  ED return precautions given for sudden increase in pain, size of hernia with accompanying fever, nausea, and/or vomiting.  The patient verbalized understanding and all questions were answered to the patient's satisfaction.   2. Patient has elected to proceed with surgical treatment. Procedure will be scheduled. umbilical, robotic assisted laparoscopic.  Currently breastfeeding  labs/images/medications/previous chart entries reviewed personally and relevant changes/updates noted above.

## 2024-03-11 NOTE — Op Note (Signed)
 Preoperative diagnosis: Umbilical incarcerated hernia Postoperative diagnosis: Umbilical incarcerated plus incarcerated hernia x 2  Procedure: Robotic assisted laparoscopic umbilical and ventral hernia repair with mesh  Anesthesia: general  Surgeon: Conrado Delay  Wound Classification: Clean  Specimen: none  Complications: None  Estimated Blood Loss: 10ml  Indications:see HPI  Findings: Umbilical hernia defect measuring 2 cm x 2.3 cm, incidental 3 mm x 5 mm ventral hernia 4 cm cephalad to the umbilical hernia, total defect space measuring 6.5 cm x 2.3 cm 2. Tension free repair achieved with ProGrip mesh and suture 3. Adequate hemostasis  Description of procedure: The patient was brought to the operating room and general anesthesia was induced. A time-out was completed verifying correct patient, procedure, site, positioning, and implant(s) and/or special equipment prior to beginning this procedure. Antibiotics were administered prior to making the incision. SCDs placed. The anterior abdominal wall was prepped and draped in the standard sterile fashion.   Palmer's point chosen for entry.  Veress needle placed and abdomen insufflated to 15cm without any dramatic increase in pressure.  Needle removed and 5 mm trocar placed via Optiview technique in same area.  No injuries noted to bowel.  Under direct visualization, a 8 mm port placed lateral to the umbilicus on the right side.  additional ports, 8mm and 12mm, along right lateral aspect placed.  Exparel  used as tap block under direct visualization.     Xi robot then docked into place.  Preperitoneal plane was entered by making a incision along the right lateral aspect of the peritoneum.  This flap was carried across the abdomen to the other side of the defect, reducing all hernia contents and preperitoneal lipoma within the hernia defect.  Incidental ventral hernia is noted in the cephalad aspect during this portion.  In the end, umbilical  hernia defect measuring 2 cm x 2.3 cm, incidental 3 mm x 5 mm ventral hernia 4 cm cephalad to the umbilical hernia, total defect space measuring 6.5 cm x 2.3 cm.  Small amounts of bleeding was controlled with cautery.  1 large arterial vessel was noted in the inferior midline during dissection and this was controlled with bipolar cautery.  Once adequate exposure of the defects and adequate space was created to place the mesh, insufflation dropped to 10mm and transfacial suture with 0 stratafix x2 used to primarily close all defects under minimal tension.    ProGrip mesh cut to size with adequate overlap around the all defect edges and placed within the abdominal cavity through 12mm port and secured to the abdominal wall centered over the defect. The peritoneal flap was then closed with a running 3-0 stratafix.  Flap defects created during the dissection portion closed with interrupted 3-0 Vicryl.  Robot was undocked.  The 12mm cannula was removed and port site was closed using Efx shield device and 0 vicryl suture, ensuring no bowels were injured during this process.  Abdomen then desufflated while camera within abdomen to ensure no signs of new bleed prior to removing camera and rest of ports completely.  12 mm port site closed with running 4-0 Monocryl.  All remaining skin incisions closed with interrupted 4-0 Monocryl in a subcuticular fashion.  All wounds then dressed with Dermabond.  Patient was then successfully awakened and transferred to PACU in stable condition.  At the end of the procedure sponge and instrument counts were correct.

## 2024-03-11 NOTE — Anesthesia Preprocedure Evaluation (Addendum)
 Anesthesia Evaluation  Patient identified by MRN, date of birth, ID band Patient awake    Reviewed: Allergy & Precautions, H&P , NPO status , Patient's Chart, lab work & pertinent test results  History of Anesthesia Complications (+) PONV and history of anesthetic complications  Airway Mallampati: II  TM Distance: >3 FB Neck ROM: full    Dental no notable dental hx.    Pulmonary neg pulmonary ROS   Pulmonary exam normal        Cardiovascular hypertension, Normal cardiovascular exam     Neuro/Psych  PSYCHIATRIC DISORDERS Anxiety     negative neurological ROS     GI/Hepatic negative GI ROS, Neg liver ROS,,,  Endo/Other  negative endocrine ROS    Renal/GU      Musculoskeletal   Abdominal Normal abdominal exam  (+)   Peds  Hematology negative hematology ROS (+) Blood dyscrasia, anemia   Anesthesia Other Findings Past Medical History: No date: Anemia No date: Anxiety and depression No date: Dyspnea No date: Fibroids No date: GERD (gastroesophageal reflux disease) No date: Headache     Comment:  migraine hx No date: Hypertension No date: Palpitations No date: PONV (postoperative nausea and vomiting)     Comment:  maybe after d/c No date: Pre-diabetes No date: Psoriasis of scalp No date: Sickle cell trait (HCC)  Past Surgical History: 2006: DILATION AND CURETTAGE OF UTERUS No date: TONSILLECTOMY  BMI    Body Mass Index: 31.18 kg/m      Reproductive/Obstetrics negative OB ROS                              Anesthesia Physical Anesthesia Plan  ASA: 2  Anesthesia Plan: General   Post-op Pain Management: Tylenol  PO (pre-op)*, Celebrex PO (pre-op)* and Gabapentin PO (pre-op)*   Induction: Intravenous  PONV Risk Score and Plan: 4 or greater and Propofol infusion, Ondansetron , Dexamethasone and Midazolam  Airway Management Planned: Oral ETT  Additional Equipment:    Intra-op Plan:   Post-operative Plan: Extubation in OR  Informed Consent: I have reviewed the patients History and Physical, chart, labs and discussed the procedure including the risks, benefits and alternatives for the proposed anesthesia with the patient or authorized representative who has indicated his/her understanding and acceptance.     Dental Advisory Given  Plan Discussed with: CRNA and Surgeon  Anesthesia Plan Comments:          Anesthesia Quick Evaluation

## 2024-03-11 NOTE — Anesthesia Procedure Notes (Signed)
 Procedure Name: Intubation Date/Time: 03/11/2024 7:31 AM  Performed by: Bill Budd, CRNAPre-anesthesia Checklist: Patient identified, Patient being monitored, Timeout performed, Emergency Drugs available and Suction available Patient Re-evaluated:Patient Re-evaluated prior to induction Oxygen Delivery Method: Circle system utilized Preoxygenation: Pre-oxygenation with 100% oxygen Induction Type: IV induction Ventilation: Mask ventilation without difficulty Laryngoscope Size: 3 and McGrath Grade View: Grade I Tube type: Oral Tube size: 7.0 mm Number of attempts: 1 Airway Equipment and Method: Stylet Placement Confirmation: ETT inserted through vocal cords under direct vision, positive ETCO2 and breath sounds checked- equal and bilateral Secured at: 21 cm Tube secured with: Tape Dental Injury: Teeth and Oropharynx as per pre-operative assessment

## 2024-03-11 NOTE — Transfer of Care (Signed)
 Immediate Anesthesia Transfer of Care Note  Patient: Cristina Allen  Procedure(s) Performed: REPAIR, HERNIA, UMBILICAL, ROBOT-ASSISTED (Abdomen)  Patient Location: PACU  Anesthesia Type:General  Level of Consciousness: drowsy  Airway & Oxygen Therapy: Patient Spontanous Breathing and Patient connected to face mask oxygen  Post-op Assessment: Pt sleeping in no pain. On 6 L o2 via Facemask. VSS  Post vital signs:     Last Vitals:  Vitals Value Taken Time  BP 123/75 03/11/24 09:52  Temp    Pulse 110 03/11/24 09:56  Resp 10 03/11/24 09:56  SpO2 100 % 03/11/24 09:56  Vitals shown include unfiled device data.  Last Pain:  Vitals:   03/11/24 0640  TempSrc: Tympanic  PainSc: 0-No pain         Complications: No notable events documented.

## 2024-03-11 NOTE — Interval H&P Note (Signed)
 History and Physical Interval Note:  03/11/2024 7:15 AM  Cristina Allen  has presented today for surgery, with the diagnosis of K42.9 Umbilical hernia.  The various methods of treatment have been discussed with the patient and family. After consideration of risks, benefits and other options for treatment, the patient has consented to  Procedure(s): REPAIR, HERNIA, UMBILICAL, ROBOT-ASSISTED (N/A) as a surgical intervention.  The patient's history has been reviewed, patient examined, no change in status, stable for surgery.  I have reviewed the patient's chart and labs.  Questions were answered to the patient's satisfaction.     Deaunna Olarte [name]

## 2024-03-11 NOTE — Discharge Instructions (Signed)
 Hernia repair, Care After ?This sheet gives you information about how to care for yourself after your procedure. Your health care provider may also give you more specific instructions. If you have problems or questions, contact your health care provider. ?What can I expect after the procedure? ?After your procedure, it is common to have the following: ?Pain in your abdomen, especially in the incision areas. You will be given medicine to control the pain. ?Tiredness. This is a normal part of the recovery process. Your energy level will return to normal over the next several weeks. ?Changes in your bowel movements, such as constipation or needing to go more often. Talk with your health care provider about how to manage this. ?Follow these instructions at home: ?Medicines ? tylenol and advil as needed for discomfort.  Please alternate between the two every four hours as needed for pain.   ? Use narcotics, if prescribed, only when tylenol and motrin is not enough to control pain. ? 325-650mg  every 8hrs to max of 3000mg /24hrs (including the 325mg  in every norco dose) for the tylenol.   ? Advil up to 800mg  per dose every 8hrs as needed for pain.   ?PLEASE RECORD NUMBER OF PILLS TAKEN UNTIL NEXT FOLLOW UP APPT.  THIS WILL HELP DETERMINE HOW READY YOU ARE TO BE RELEASED FROM ANY ACTIVITY RESTRICTIONS ?Do not drive or use heavy machinery while taking prescription pain medicine. ?Do not drink alcohol while taking prescription pain medicine. ? ?Incision care ? ?  ?Follow instructions from your health care provider about how to take care of your incision areas. Make sure you: ?Keep your incisions clean and dry. ?Wash your hands with soap and water before and after applying medicine to the areas, and before and after changing your bandage (dressing). If soap and water are not available, use hand sanitizer. ?Change your dressing as told by your health care provider. ?Leave stitches (sutures), skin glue, or adhesive strips in  place. These skin closures may need to stay in place for 2 weeks or longer. If adhesive strip edges start to loosen and curl up, you may trim the loose edges. Do not remove adhesive strips completely unless your health care provider tells you to do that. ?Do not wear tight clothing over the incisions. Tight clothing may rub and irritate the incision areas, which may cause the incisions to open. ?Do not take baths, swim, or use a hot tub until your health care provider approves. OK TO SHOWER IN 24HRS.   ?Check your incision area every day for signs of infection. Check for: ?More redness, swelling, or pain. ?More fluid or blood. ?Warmth. ?Pus or a bad smell. ?Activity ?Avoid lifting anything that is heavier than 10 lb (4.5 kg) for 2 weeks or until your health care provider says it is okay. ?No pushing/pulling greater than 30lbs ?You may resume normal activities as told by your health care provider. Ask your health care provider what activities are safe for you. ?Take rest breaks during the day as needed. ?Eating and drinking ?Follow instructions from your health care provider about what you can eat after surgery. ?To prevent or treat constipation while you are taking prescription pain medicine, your health care provider may recommend that you: ?Drink enough fluid to keep your urine clear or pale yellow. ?Take over-the-counter or prescription medicines. ?Eat foods that are high in fiber, such as fresh fruits and vegetables, whole grains, and beans. ?Limit foods that are high in fat and processed sugars, such as fried and  sweet foods. ?General instructions ?Ask your health care provider when you will need an appointment to get your sutures or staples removed. ?Keep all follow-up visits as told by your health care provider. This is important. ?Contact a health care provider if: ?You have more redness, swelling, or pain around your incisions. ?You have more fluid or blood coming from the incisions. ?Your incisions feel  warm to the touch. ?You have pus or a bad smell coming from your incisions or your dressing. ?You have a fever. ?You have an incision that breaks open (edges not staying together) after sutures or staples have been removed. ?You develop a rash. ?You have chest pain or difficulty breathing. ?You have pain or swelling in your legs. ?You feel light-headed or you faint. ?Your abdomen swells (becomes distended). ?You have nausea or vomiting. ?You have blood in your stool (feces). ?This information is not intended to replace advice given to you by your health care provider. Make sure you discuss any questions you have with your health care provider. ?Document Released: 04/05/2005 Document Revised: 06/05/2018 Document Reviewed: 06/17/2016 ?[employer] Interactive Patient Education ? 2019 [employer] ?  ? ?

## 2024-03-11 NOTE — Anesthesia Postprocedure Evaluation (Signed)
 Anesthesia Post Note  Patient: Ashrita Morreale  Procedure(s) Performed: REPAIR, HERNIA, UMBILICAL, ROBOT-ASSISTED (Abdomen)  Patient location during evaluation: PACU Anesthesia Type: General Level of consciousness: awake and alert Pain management: pain level controlled Vital Signs Assessment: post-procedure vital signs reviewed and stable Respiratory status: spontaneous breathing, nonlabored ventilation and respiratory function stable Cardiovascular status: blood pressure returned to baseline and stable Postop Assessment: no apparent nausea or vomiting Anesthetic complications: no   No notable events documented.   Last Vitals:  Vitals:   03/11/24 1110 03/11/24 1159  BP:  126/76  Pulse: 92 79  Resp:  15  Temp:  36.9 C  SpO2: 92% 97%    Last Pain:  Vitals:   03/11/24 1159  TempSrc: Temporal  PainSc:                  Baltazar Bonier

## 2024-05-19 ENCOUNTER — Other Ambulatory Visit: Payer: Self-pay | Admitting: Surgery

## 2024-05-19 DIAGNOSIS — K429 Umbilical hernia without obstruction or gangrene: Secondary | ICD-10-CM

## 2024-05-19 DIAGNOSIS — R1084 Generalized abdominal pain: Secondary | ICD-10-CM

## 2024-05-28 ENCOUNTER — Ambulatory Visit
Admission: RE | Admit: 2024-05-28 | Discharge: 2024-05-28 | Disposition: A | Discharge status: Home / Self Care | Source: Ambulatory Visit | Attending: Surgery | Admitting: Surgery

## 2024-05-28 ENCOUNTER — Encounter: Payer: Self-pay | Admitting: Oncology

## 2024-05-28 DIAGNOSIS — R1084 Generalized abdominal pain: Secondary | ICD-10-CM | POA: Diagnosis present

## 2024-05-28 DIAGNOSIS — K429 Umbilical hernia without obstruction or gangrene: Secondary | ICD-10-CM | POA: Insufficient documentation

## 2024-05-28 MED ORDER — IOHEXOL 300 MG/ML  SOLN
100.0000 mL | Freq: Once | INTRAMUSCULAR | Status: AC | PRN
  Administered 2024-05-28: 100 mL via INTRAVENOUS
